# Patient Record
Sex: Female | Born: 1965 | Race: White | Hispanic: No | Marital: Married | Smoking: Never smoker
Health system: Southern US, Community
[De-identification: ages and names within clinical notes are randomized; demographics above are authoritative.]

## PROBLEM LIST (undated history)

## (undated) DIAGNOSIS — G8929 Other chronic pain: Secondary | ICD-10-CM

## (undated) DIAGNOSIS — M329 Systemic lupus erythematosus, unspecified: Secondary | ICD-10-CM

## (undated) DIAGNOSIS — J42 Unspecified chronic bronchitis: Secondary | ICD-10-CM

## (undated) DIAGNOSIS — M549 Dorsalgia, unspecified: Secondary | ICD-10-CM

## (undated) DIAGNOSIS — IMO0002 Reserved for concepts with insufficient information to code with codable children: Secondary | ICD-10-CM

## (undated) DIAGNOSIS — F329 Major depressive disorder, single episode, unspecified: Secondary | ICD-10-CM

## (undated) DIAGNOSIS — K219 Gastro-esophageal reflux disease without esophagitis: Secondary | ICD-10-CM

## (undated) DIAGNOSIS — R011 Cardiac murmur, unspecified: Secondary | ICD-10-CM

## (undated) DIAGNOSIS — F419 Anxiety disorder, unspecified: Secondary | ICD-10-CM

## (undated) DIAGNOSIS — M797 Fibromyalgia: Secondary | ICD-10-CM

## (undated) DIAGNOSIS — E669 Obesity, unspecified: Secondary | ICD-10-CM

## (undated) DIAGNOSIS — I10 Essential (primary) hypertension: Secondary | ICD-10-CM

## (undated) DIAGNOSIS — F32A Depression, unspecified: Secondary | ICD-10-CM

## (undated) DIAGNOSIS — D219 Benign neoplasm of connective and other soft tissue, unspecified: Secondary | ICD-10-CM

## (undated) DIAGNOSIS — E119 Type 2 diabetes mellitus without complications: Secondary | ICD-10-CM

## (undated) HISTORY — DX: Benign neoplasm of connective and other soft tissue, unspecified: D21.9

## (undated) HISTORY — PX: APPENDECTOMY: SHX54

---

## 1989-05-10 HISTORY — PX: TUBAL LIGATION: SHX77

## 1998-05-10 HISTORY — PX: LAPAROSCOPIC CHOLECYSTECTOMY: SUR755

## 2001-09-17 ENCOUNTER — Emergency Department (HOSPITAL_COMMUNITY): Admission: EM | Admit: 2001-09-17 | Discharge: 2001-09-17 | Payer: Self-pay

## 2001-09-24 ENCOUNTER — Emergency Department (HOSPITAL_COMMUNITY): Admission: EM | Admit: 2001-09-24 | Discharge: 2001-09-25 | Payer: Self-pay | Admitting: Emergency Medicine

## 2006-08-15 ENCOUNTER — Emergency Department (HOSPITAL_COMMUNITY): Admission: EM | Admit: 2006-08-15 | Discharge: 2006-08-15 | Payer: Self-pay | Admitting: Emergency Medicine

## 2006-08-16 ENCOUNTER — Ambulatory Visit: Payer: Self-pay | Admitting: Psychiatry

## 2006-08-16 ENCOUNTER — Inpatient Hospital Stay (HOSPITAL_COMMUNITY): Admission: AD | Admit: 2006-08-16 | Discharge: 2006-08-19 | Payer: Self-pay | Admitting: Psychiatry

## 2012-04-18 ENCOUNTER — Other Ambulatory Visit: Payer: Self-pay | Admitting: Orthopedic Surgery

## 2012-04-18 DIAGNOSIS — R609 Edema, unspecified: Secondary | ICD-10-CM

## 2012-04-19 ENCOUNTER — Inpatient Hospital Stay: Admission: RE | Admit: 2012-04-19 | Payer: Self-pay | Source: Ambulatory Visit

## 2012-04-25 ENCOUNTER — Other Ambulatory Visit: Payer: Self-pay

## 2012-06-02 ENCOUNTER — Ambulatory Visit
Admission: RE | Admit: 2012-06-02 | Discharge: 2012-06-02 | Disposition: A | Discharge status: Home / Self Care | Payer: PRIVATE HEALTH INSURANCE | Source: Ambulatory Visit | Attending: Interventional Cardiology | Admitting: Interventional Cardiology

## 2012-06-02 ENCOUNTER — Other Ambulatory Visit: Payer: Self-pay | Admitting: Interventional Cardiology

## 2012-06-02 DIAGNOSIS — R609 Edema, unspecified: Secondary | ICD-10-CM

## 2013-07-26 ENCOUNTER — Encounter (HOSPITAL_COMMUNITY): Payer: Self-pay | Admitting: Emergency Medicine

## 2013-07-26 ENCOUNTER — Emergency Department (HOSPITAL_COMMUNITY)
Admission: EM | Admit: 2013-07-26 | Discharge: 2013-07-26 | Disposition: A | Discharge status: Home / Self Care | Payer: PRIVATE HEALTH INSURANCE | Attending: Emergency Medicine | Admitting: Emergency Medicine

## 2013-07-26 ENCOUNTER — Emergency Department (HOSPITAL_COMMUNITY): Payer: PRIVATE HEALTH INSURANCE

## 2013-07-26 DIAGNOSIS — E119 Type 2 diabetes mellitus without complications: Secondary | ICD-10-CM | POA: Insufficient documentation

## 2013-07-26 DIAGNOSIS — R238 Other skin changes: Secondary | ICD-10-CM | POA: Insufficient documentation

## 2013-07-26 DIAGNOSIS — E669 Obesity, unspecified: Secondary | ICD-10-CM | POA: Insufficient documentation

## 2013-07-26 DIAGNOSIS — R079 Chest pain, unspecified: Secondary | ICD-10-CM | POA: Insufficient documentation

## 2013-07-26 DIAGNOSIS — R0602 Shortness of breath: Secondary | ICD-10-CM | POA: Insufficient documentation

## 2013-07-26 DIAGNOSIS — R110 Nausea: Secondary | ICD-10-CM | POA: Insufficient documentation

## 2013-07-26 HISTORY — DX: Obesity, unspecified: E66.9

## 2013-07-26 LAB — CBC
HCT: 31.5 % — ABNORMAL LOW (ref 36.0–46.0)
Hemoglobin: 9.7 g/dL — ABNORMAL LOW (ref 12.0–15.0)
MCH: 20.8 pg — ABNORMAL LOW (ref 26.0–34.0)
MCHC: 30.8 g/dL (ref 30.0–36.0)
MCV: 67.5 fL — ABNORMAL LOW (ref 78.0–100.0)
Platelets: 339 10*3/uL (ref 150–400)
RBC: 4.67 MIL/uL (ref 3.87–5.11)
RDW: 18.2 % — ABNORMAL HIGH (ref 11.5–15.5)
WBC: 6.6 K/uL (ref 4.0–10.5)

## 2013-07-26 LAB — BASIC METABOLIC PANEL
CO2: 27 mEq/L (ref 19–32)
Calcium: 10.2 mg/dL (ref 8.4–10.5)
GFR calc non Af Amer: >90 mL/min (ref >90)
Glucose, Bld: 161 mg/dL — ABNORMAL HIGH (ref 70–99)
Potassium: 3.5 mEq/L — ABNORMAL LOW (ref 3.7–5.3)
Sodium: 139 mEq/L (ref 137–147)

## 2013-07-26 LAB — BASIC METABOLIC PANEL WITH GFR
BUN: 11 mg/dL (ref 6–23)
Chloride: 97 meq/L (ref 96–112)
Creatinine, Ser: 0.65 mg/dL (ref 0.50–1.10)
GFR calc Af Amer: >90 mL/min (ref >90)

## 2013-07-26 LAB — I-STAT TROPONIN, ED: Troponin i, poc: 0 ng/mL (ref 0.00–0.08)

## 2013-07-26 LAB — PRO B NATRIURETIC PEPTIDE: Pro B Natriuretic peptide (BNP): 45 pg/mL (ref 0–125)

## 2013-07-26 MED ORDER — GI COCKTAIL ~~LOC~~
30.0000 mL | Freq: Once | ORAL | Status: AC
  Administered 2013-07-26: 30 mL via ORAL
  Filled 2013-07-26: qty 30

## 2013-07-26 NOTE — ED Notes (Signed)
Pt sts has history of GERD and takes Tums and omeprazole. Pt sts has mid-upper chest pain initially felt like GERD but is not relieved by current medications and patient sts pain his higher than normal. Pt endorses shortness of breath & nausea with chest pain episodes and sts it is precipitated by eating and drinking anything. Pt sts episodes typically last a few seconds and describes it a squeezing pain, non-radiating. Pt in NAD at present time. Skin warm and dry. Denies pain at present time, radial pulses 2+.

## 2013-07-26 NOTE — ED Notes (Signed)
Pt. reports intermittent mid chest pain onset 2 days ago with slight SOB , denies nausea or diaphoresis .

## 2013-07-26 NOTE — Discharge Instructions (Signed)
Gastroesophageal Reflux Disease, Adult Gastroesophageal reflux disease (GERD) happens when acid from your stomach flows up into the esophagus. When acid comes in contact with the esophagus, the acid causes soreness (inflammation) in the esophagus. Over time, GERD may create small holes (ulcers) in the lining of the esophagus. CAUSES   Increased body weight. This puts pressure on the stomach, making acid rise from the stomach into the esophagus.  Smoking. This increases acid production in the stomach.  Drinking alcohol. This causes decreased pressure in the lower esophageal sphincter (valve or ring of muscle between the esophagus and stomach), allowing acid from the stomach into the esophagus.  Late evening meals and a full stomach. This increases pressure and acid production in the stomach.  A malformed lower esophageal sphincter. Sometimes, no cause is found. SYMPTOMS   Burning pain in the lower part of the mid-chest behind the breastbone and in the mid-stomach area. This may occur twice a week or more often.  Trouble swallowing.  Sore throat.  Dry cough.  Asthma-like symptoms including chest tightness, shortness of breath, or wheezing. DIAGNOSIS  Your caregiver may be able to diagnose GERD based on your symptoms. In some cases, X-rays and other tests may be done to check for complications or to check the condition of your stomach and esophagus. TREATMENT  Your caregiver may recommend over-the-counter or prescription medicines to help decrease acid production. Ask your caregiver before starting or adding any new medicines.  HOME CARE INSTRUCTIONS   Change the factors that you can control. Ask your caregiver for guidance concerning weight loss, quitting smoking, and alcohol consumption.  Avoid foods and drinks that make your symptoms worse, such as:  Caffeine or alcoholic drinks.  Chocolate.  Peppermint or mint flavorings.  Garlic and onions.  Spicy foods.  Citrus fruits,  such as oranges, lemons, or limes.  Tomato-based foods such as sauce, chili, salsa, and pizza.  Fried and fatty foods.  Avoid lying down for the 3 hours prior to your bedtime or prior to taking a nap.  Eat small, frequent meals instead of large meals.  Wear loose-fitting clothing. Do not wear anything tight around your waist that causes pressure on your stomach.  Raise the head of your bed 6 to 8 inches with wood blocks to help you sleep. Extra pillows will not help.  Only take over-the-counter or prescription medicines for pain, discomfort, or fever as directed by your caregiver.  Do not take aspirin, ibuprofen, or other nonsteroidal anti-inflammatory drugs (NSAIDs). SEEK IMMEDIATE MEDICAL CARE IF:   You have pain in your arms, neck, jaw, teeth, or back.  Your pain increases or changes in intensity or duration.  You develop nausea, vomiting, or sweating (diaphoresis).  You develop shortness of breath, or you faint.  Your vomit is green, yellow, black, or looks like coffee grounds or blood.  Your stool is red, bloody, or black. These symptoms could be signs of other problems, such as heart disease, gastric bleeding, or esophageal bleeding. MAKE SURE YOU:   Understand these instructions.  Will watch your condition.  Will get help right away if you are not doing well or get worse. Document Released: 02/03/2005 Document Revised: 07/19/2011 Document Reviewed: 11/13/2010 [facility] Patient Information 2014 [place], [place]. Chest Pain (Nonspecific) Chest pain has many causes. Your pain could be caused by something serious, such as a heart attack or a blood clot in the lungs. It could also be caused by something less serious, such as a chest bruise or a virus.  Follow up with your doctor. More lab tests or other studies may be needed to find the cause of your pain. Most of the time, nonspecific chest pain will improve within 2 to 3 days of rest and mild pain medicine. HOME CARE  For  chest bruises, you may put ice on the sore area for 15-20 minutes, 03-04 times a day. Do this only if it makes you feel better.  Put ice in a plastic bag.  Place a towel between the skin and the bag.  Rest for the next 2 to 3 days.  Go back to work if the pain improves.  See your doctor if the pain lasts longer than 1 to 2 weeks.  Only take medicine as told by your doctor.  Quit smoking if you smoke. GET HELP RIGHT AWAY IF:   There is more pain or pain that spreads to the arm, neck, jaw, back, or belly (abdomen).  You have shortness of breath.  You cough more than usual or cough up blood.  You have very bad back or belly pain, feel sick to your stomach (nauseous), or throw up (vomit).  You have very bad weakness.  You pass out (faint).  You have a fever. Any of these problems may be serious and may be an emergency. Do not wait to see if the problems will go away. Get medical help right away. Call your local emergency services 911 in [place] Do not drive yourself to the hospital. MAKE SURE YOU:   Understand these instructions.  Will watch this condition.  Will get help right away if you or your child is not doing well or gets worse. Document Released: 10/13/2007 Document Revised: 07/19/2011 Document Reviewed: 10/13/2007 [facility] Patient Information 2014 [place], [place].

## 2013-07-26 NOTE — ED Provider Notes (Signed)
CSN: [id number]     Arrival date & time 07/26/13  1950 History   First MD Initiated Contact with Patient 07/26/13 2111     Chief Complaint  Patient presents with  . Chest Pain     (Consider location/radiation/quality/duration/timing/severity/associated sxs/prior Treatment) Patient is a 48 y.o. female presenting with chest pain. The history is provided by the patient. No language interpreter was used.  Chest Pain Pain location:  L chest Pain quality: throbbing   Pain radiates to:  Neck Pain radiates to the back: no   Pain severity:  Moderate Onset quality:  Sudden Duration:  3 days Timing:  Constant Progression:  Waxing and waning Chronicity:  New Context: at rest   Associated symptoms: nausea and shortness of breath   Associated symptoms: no lower extremity edema and not vomiting     Past Medical History  Diagnosis Date  . Diabetes mellitus without complication   . Obesity    Past Surgical History  Procedure Laterality Date  . Cholecystectomy    . Appendectomy    . Cesarean section     No family history on file. History  Substance Use Topics  . Smoking status: Never Smoker   . Smokeless tobacco: Not on file  . Alcohol Use: No   OB History   Grav Para Term Preterm Abortions TAB SAB Ect Mult Living                 Review of Systems  Respiratory: Positive for shortness of breath.   Cardiovascular: Positive for chest pain.  Gastrointestinal: Positive for nausea. Negative for vomiting.  All other systems reviewed and are negative.      Allergies  Review of patient's allergies indicates no known allergies.  Home Medications  No current outpatient prescriptions on file. BP 153/67  Pulse 97  Temp(Src) 98 F (36.7 C) (Oral)  Resp 16  Ht 5\' 2"  (1.575 m)  Wt 220 lb 8 oz (100.018 kg)  BMI 40.32 kg/m2  SpO2 98%  LMP 07/24/2013 Physical Exam  Nursing note and vitals reviewed. Constitutional: She is oriented to person, place, and time. She appears  well-developed and well-nourished.  HENT:  Head: Normocephalic.  Eyes: Pupils are equal, round, and reactive to light.  Neck: Normal range of motion.  Cardiovascular: Normal rate and regular rhythm.   Pulmonary/Chest: Effort normal and breath sounds normal.  Abdominal: Soft.  Musculoskeletal: Normal range of motion.  Neurological: She is alert and oriented to person, place, and time.  Skin: Skin is warm and dry.  Psychiatric: She has a normal mood and affect. Her behavior is normal. Judgment and thought content normal.    ED Course  Procedures (including critical care time) Labs Review Labs Reviewed  CBC - Abnormal; Notable for the following:    Hemoglobin 9.7 (*)    HCT 31.5 (*)    MCV 67.5 (*)    MCH 20.8 (*)    RDW 18.2 (*)    All other components within normal limits  BASIC METABOLIC PANEL - Abnormal; Notable for the following:    Potassium 3.5 (*)    Glucose, Bld 161 (*)    All other components within normal limits  PRO B NATRIURETIC PEPTIDE  I-STAT TROPOININ, ED   Imaging Review Dg Chest 2 View  07/26/2013   CLINICAL DATA:  Pain.  Shortness of breath  EXAM: CHEST  2 VIEW  COMPARISON:  None.  FINDINGS: The heart size and mediastinal contours are within normal limits. Both lungs are  clear. The visualized skeletal structures are unremarkable. Surgical clips right upper quadrant.  IMPRESSION: No active cardiopulmonary disease.   Electronically Signed   By: Marcello Moores  Register   On: 07/26/2013 21:09     EKG Interpretation None     Atypical chest pain, low probability of cardiac origin.  ECG without ischemic changes, normal troponin.  Chronic mild anemia.  Left upper chest wall pain.  No dyspnea, tachycardia, or cough.  MDM   Final diagnoses:  None    Chest pain    Norman Herrlich, NP 07/27/13 0022

## 2013-08-02 NOTE — ED Provider Notes (Signed)
Medical screening examination/treatment/procedure(s) were performed by non-physician practitioner and as supervising physician I was immediately available for consultation/collaboration.   EKG Interpretation   Date/Time:  Thursday July 26 2013 19:57:14 EDT Ventricular Rate:  91 PR Interval:  164 QRS Duration: 82 QT Interval:  358 QTC Calculation: 440 R Axis:   18 Text Interpretation:  Normal sinus rhythm Poor R wave progression  Borderline ECG No previous tracing Confirmed by [facility]  MD, MICHEAL ([id number])  on 07/26/2013 10:50:46 PM        Saddie Benders. Canda Podgorski, MD 08/02/13 1554

## 2014-05-10 HISTORY — PX: ENDOMETRIAL BIOPSY: SHX622

## 2015-05-26 ENCOUNTER — Encounter (HOSPITAL_COMMUNITY): Payer: Self-pay | Admitting: Emergency Medicine

## 2015-05-26 ENCOUNTER — Emergency Department (HOSPITAL_COMMUNITY): Payer: 59

## 2015-05-26 ENCOUNTER — Observation Stay (HOSPITAL_COMMUNITY)
Admission: EM | Admit: 2015-05-26 | Discharge: 2015-05-28 | Disposition: A | Discharge status: Home / Self Care | Payer: 59 | Attending: Internal Medicine | Admitting: Internal Medicine

## 2015-05-26 DIAGNOSIS — E1169 Type 2 diabetes mellitus with other specified complication: Secondary | ICD-10-CM

## 2015-05-26 DIAGNOSIS — E669 Obesity, unspecified: Secondary | ICD-10-CM | POA: Diagnosis not present

## 2015-05-26 DIAGNOSIS — D259 Leiomyoma of uterus, unspecified: Secondary | ICD-10-CM | POA: Diagnosis not present

## 2015-05-26 DIAGNOSIS — R531 Weakness: Secondary | ICD-10-CM | POA: Diagnosis not present

## 2015-05-26 DIAGNOSIS — R55 Syncope and collapse: Secondary | ICD-10-CM | POA: Diagnosis not present

## 2015-05-26 DIAGNOSIS — R5383 Other fatigue: Secondary | ICD-10-CM

## 2015-05-26 DIAGNOSIS — M7989 Other specified soft tissue disorders: Secondary | ICD-10-CM | POA: Insufficient documentation

## 2015-05-26 DIAGNOSIS — I10 Essential (primary) hypertension: Secondary | ICD-10-CM | POA: Diagnosis not present

## 2015-05-26 DIAGNOSIS — M549 Dorsalgia, unspecified: Secondary | ICD-10-CM | POA: Diagnosis not present

## 2015-05-26 DIAGNOSIS — E119 Type 2 diabetes mellitus without complications: Secondary | ICD-10-CM | POA: Diagnosis not present

## 2015-05-26 DIAGNOSIS — Z7984 Long term (current) use of oral hypoglycemic drugs: Secondary | ICD-10-CM | POA: Diagnosis not present

## 2015-05-26 DIAGNOSIS — R079 Chest pain, unspecified: Secondary | ICD-10-CM | POA: Diagnosis not present

## 2015-05-26 DIAGNOSIS — I890 Lymphedema, not elsewhere classified: Secondary | ICD-10-CM | POA: Insufficient documentation

## 2015-05-26 DIAGNOSIS — M797 Fibromyalgia: Secondary | ICD-10-CM | POA: Diagnosis not present

## 2015-05-26 DIAGNOSIS — G8929 Other chronic pain: Secondary | ICD-10-CM | POA: Diagnosis not present

## 2015-05-26 DIAGNOSIS — R0602 Shortness of breath: Secondary | ICD-10-CM | POA: Insufficient documentation

## 2015-05-26 HISTORY — DX: Unspecified chronic bronchitis: J42

## 2015-05-26 HISTORY — DX: Fibromyalgia: M79.7

## 2015-05-26 HISTORY — DX: Other chronic pain: G89.29

## 2015-05-26 HISTORY — DX: Anxiety disorder, unspecified: F41.9

## 2015-05-26 HISTORY — DX: Gastro-esophageal reflux disease without esophagitis: K21.9

## 2015-05-26 HISTORY — DX: Cardiac murmur, unspecified: R01.1

## 2015-05-26 HISTORY — DX: Depression, unspecified: F32.A

## 2015-05-26 HISTORY — DX: Dorsalgia, unspecified: M54.9

## 2015-05-26 HISTORY — DX: Essential (primary) hypertension: I10

## 2015-05-26 HISTORY — DX: Type 2 diabetes mellitus without complications: E11.9

## 2015-05-26 HISTORY — DX: Major depressive disorder, single episode, unspecified: F32.9

## 2015-05-26 LAB — URINALYSIS, ROUTINE W REFLEX MICROSCOPIC
BILIRUBIN URINE: NEGATIVE
GLUCOSE, UA: NEGATIVE mg/dL
HGB URINE DIPSTICK: NEGATIVE
Ketones, ur: NEGATIVE mg/dL
Leukocytes, UA: NEGATIVE
Nitrite: NEGATIVE
Protein, ur: NEGATIVE mg/dL
SPECIFIC GRAVITY, URINE: 1.031 — AB (ref 1.005–1.030)
pH: 5 (ref 5.0–8.0)

## 2015-05-26 LAB — CBC
HEMATOCRIT: 43.6 % (ref 36.0–46.0)
Hemoglobin: 14.2 g/dL (ref 12.0–15.0)
MCH: 28.2 pg (ref 26.0–34.0)
MCHC: 32.6 g/dL (ref 30.0–36.0)
MCV: 86.7 fL (ref 78.0–100.0)
Platelets: 288 10*3/uL (ref 150–400)
RBC: 5.03 MIL/uL (ref 3.87–5.11)
RDW: 12.8 % (ref 11.5–15.5)
WBC: 9.3 10*3/uL (ref 4.0–10.5)

## 2015-05-26 LAB — BASIC METABOLIC PANEL
ANION GAP: 12 (ref 5–15)
BUN: 19 mg/dL (ref 6–20)
CHLORIDE: 103 mmol/L (ref 101–111)
CO2: 24 mmol/L (ref 22–32)
Calcium: 9.8 mg/dL (ref 8.9–10.3)
Creatinine, Ser: 0.82 mg/dL (ref 0.44–1.00)
GFR calc Af Amer: >60 mL/min (ref >60)
GFR calc non Af Amer: >60 mL/min (ref >60)
GLUCOSE: 188 mg/dL — AB (ref 65–99)
Potassium: 4.8 mmol/L (ref 3.5–5.1)
Sodium: 139 mmol/L (ref 135–145)

## 2015-05-26 LAB — D-DIMER, QUANTITATIVE: D-Dimer, Quant: 0.51 ug/mL-FEU — ABNORMAL HIGH (ref 0.00–0.50)

## 2015-05-26 LAB — I-STAT TROPONIN, ED
Troponin i, poc: 0 ng/mL (ref 0.00–0.08)
Troponin i, poc: 0.01 ng/mL (ref 0.00–0.08)

## 2015-05-26 MED ORDER — IOHEXOL 350 MG/ML SOLN
100.0000 mL | Freq: Once | INTRAVENOUS | Status: AC | PRN
  Administered 2015-05-26: 100 mL via INTRAVENOUS

## 2015-05-26 MED ORDER — ZOLPIDEM TARTRATE 5 MG PO TABS
10.0000 mg | ORAL_TABLET | Freq: Every day | ORAL | Status: DC
  Administered 2015-05-27 (×2): 10 mg via ORAL
  Filled 2015-05-26 (×2): qty 2

## 2015-05-26 MED ORDER — ENOXAPARIN SODIUM 40 MG/0.4ML ~~LOC~~ SOLN
40.0000 mg | SUBCUTANEOUS | Status: DC
  Administered 2015-05-27 (×2): 40 mg via SUBCUTANEOUS
  Filled 2015-05-26 (×2): qty 0.4

## 2015-05-26 MED ORDER — MELATONIN 10 MG PO TABS: 10.0000 mg | ORAL_TABLET | Freq: Every day | ORAL | Status: DC

## 2015-05-26 MED ORDER — FERROUS SULFATE 325 (65 FE) MG PO TABS
325.0000 mg | ORAL_TABLET | Freq: Every day | ORAL | Status: DC
  Administered 2015-05-27: 325 mg via ORAL
  Filled 2015-05-26: qty 1

## 2015-05-26 MED ORDER — SODIUM CHLORIDE 0.9 % IV SOLN
INTRAVENOUS | Status: DC
  Administered 2015-05-27 – 2015-05-28 (×3): via INTRAVENOUS

## 2015-05-26 MED ORDER — ONDANSETRON HCL 4 MG/2ML IJ SOLN: 4.0000 mg | Freq: Four times a day (QID) | INTRAMUSCULAR | Status: DC | PRN

## 2015-05-26 MED ORDER — PANTOPRAZOLE SODIUM 40 MG PO TBEC
40.0000 mg | DELAYED_RELEASE_TABLET | Freq: Every day | ORAL | Status: DC
  Administered 2015-05-27 – 2015-05-28 (×3): 40 mg via ORAL
  Filled 2015-05-26 (×3): qty 1

## 2015-05-26 MED ORDER — ONDANSETRON HCL 4 MG PO TABS: 4.0000 mg | ORAL_TABLET | Freq: Four times a day (QID) | ORAL | Status: DC | PRN

## 2015-05-26 MED ORDER — LEVALBUTEROL HCL 0.63 MG/3ML IN NEBU: 0.6300 mg | INHALATION_SOLUTION | Freq: Four times a day (QID) | RESPIRATORY_TRACT | Status: DC | PRN

## 2015-05-26 MED ORDER — ACETAMINOPHEN 650 MG RE SUPP: 650.0000 mg | Freq: Four times a day (QID) | RECTAL | Status: DC | PRN

## 2015-05-26 MED ORDER — LISINOPRIL 20 MG PO TABS
20.0000 mg | ORAL_TABLET | Freq: Every day | ORAL | Status: DC
  Administered 2015-05-27 – 2015-05-28 (×3): 20 mg via ORAL
  Filled 2015-05-26 (×4): qty 1

## 2015-05-26 MED ORDER — VITAMIN D3 50 MCG (2000 UT) PO CAPS: 2000.0000 [IU] | ORAL_CAPSULE | Freq: Every day | ORAL | Status: DC

## 2015-05-26 MED ORDER — SODIUM CHLORIDE 0.9 % IV BOLUS (SEPSIS)
1000.0000 mL | Freq: Once | INTRAVENOUS | Status: AC
  Administered 2015-05-26: 1000 mL via INTRAVENOUS

## 2015-05-26 MED ORDER — SODIUM CHLORIDE 0.9 % IJ SOLN
3.0000 mL | Freq: Two times a day (BID) | INTRAMUSCULAR | Status: DC
  Administered 2015-05-27 – 2015-05-28 (×3): 3 mL via INTRAVENOUS

## 2015-05-26 MED ORDER — ACETAMINOPHEN 325 MG PO TABS
650.0000 mg | ORAL_TABLET | Freq: Four times a day (QID) | ORAL | Status: DC | PRN
Start: 2015-05-26 — End: 2015-05-28
  Administered 2015-05-27: 650 mg via ORAL
  Filled 2015-05-26 (×2): qty 2

## 2015-05-26 MED ORDER — MAGNESIUM 200 MG PO TABS
1.0000 | ORAL_TABLET | Freq: Every day | ORAL | Status: DC
  Administered 2015-05-27 – 2015-05-28 (×3): 200 mg via ORAL
  Filled 2015-05-26 (×6): qty 1

## 2015-05-26 MED ORDER — LYSINE 1000 MG PO TABS: 1000.0000 mg | ORAL_TABLET | Freq: Every day | ORAL | Status: DC

## 2015-05-26 MED ORDER — INSULIN ASPART 100 UNIT/ML ~~LOC~~ SOLN
0.0000 [IU] | SUBCUTANEOUS | Status: DC
  Administered 2015-05-27: 1 [IU] via SUBCUTANEOUS
  Administered 2015-05-27 (×2): 2 [IU] via SUBCUTANEOUS

## 2015-05-26 MED ORDER — ZOLPIDEM TARTRATE 5 MG PO TABS: 5.0000 mg | ORAL_TABLET | Freq: Every day | ORAL | Status: DC

## 2015-05-26 MED ORDER — VITAMIN D 1000 UNITS PO TABS
2000.0000 [IU] | ORAL_TABLET | Freq: Every day | ORAL | Status: DC
  Administered 2015-05-27 – 2015-05-28 (×3): 2000 [IU] via ORAL
  Filled 2015-05-26 (×3): qty 2

## 2015-05-26 NOTE — H&P (Addendum)
[facility] History and Physical  Elizabeth Schneider NFA:[id number] DOB: 14-Jun-1965 DOA: 05/26/2015  Referring physician: ED PCP: No PCP Per Patient   Chief Complaint: Heaviness in arms, chest, and jaw  HPI:  Elizabeth Schneider is a 50 year old female with a past medical history of diabetes mellitus type 2 and obesity; who presents with complaints of heaviness. atient states symptoms started approximately 5 days ago. Describes symptoms as a feeling of heaviness in her chest, bilateral arms, and most recently jaw yesterday. Reports difficulty in getting up and moving due to shortness of breath with exertion. She had been placed on Bactrim for urinary tract infection on 1/6 and had tolerated the medication well until the onset of her presenting symptoms. She reported that she thought that the medication could be causing her "heaviness " symptoms so she self stopped the medication. Currently, denies any burning or urinary frequency symptoms. She reports she's never had symptoms like this in the past.  Associated symptoms include pinching-like chest pain that occurs intermittently and self resolves. Patient reports that she's gained approximately 18 pounds in the last 2 months, but denies any changes in eating habits or physical activity. Patient reportedly had 4 witnessed episodes of syncope while in the emergency department. Patient notes that this is been going on since she was acute and not the specific reason why she presented today.  Upon admission to the emergency room the patient was evaluated and seen have elevated d-dimer of 0.51. Thereafter she is checked with a CT PE protocol which was deemed to be negative. Initial troponins 3 were negative, and  EKG showed sinus tachycardia. Lab work was otherwise unremarkable.  Review of Systems  Constitutional: Positive for malaise/fatigue. Negative for weight loss.       Weight gain  HENT: Negative for hearing loss and tinnitus.   Eyes: Negative for  photophobia and pain.  Respiratory: Positive for shortness of breath. Negative for hemoptysis and sputum production.   Cardiovascular: Positive for chest pain and leg swelling.  Gastrointestinal: Negative for abdominal pain and diarrhea.  Genitourinary: Negative for urgency and frequency.  Musculoskeletal: Negative for back pain, joint pain and falls.  Skin: Negative for itching and rash.  Neurological: Positive for weakness. Negative for focal weakness and seizures.  Endo/Heme/Allergies: Negative for environmental allergies. Does not bruise/bleed easily.  Psychiatric/Behavioral: Negative for hallucinations and substance abuse.       Past Medical History  Diagnosis Date  . Diabetes mellitus without complication ([place])   . Obesity      Past Surgical History  Procedure Laterality Date  . Cholecystectomy    . Appendectomy    . Cesarean section        Social History:  reports that she has never smoked. She does not have any smokeless tobacco history on file. She reports that she drinks alcohol. She reports that she does not use illicit drugs. Where does patient live--home  ? and with whom if at home? Husband Can patient participate in ADLs? Yes  Allergies  Allergen Reactions  . Hydrocodone Itching  . Other Itching    Ranch dressing ingredient  . Pravastatin Other (See Comments)    Muscle aches and pains  . Sulfa Antibiotics Other (See Comments)    Heaviness in arms    No family history on file.     Prior to Admission medications   Medication Sig Start Date End Date Taking? Authorizing Provider  acyclovir (ZOVIRAX) 400 MG tablet Take 400 mg by mouth 2 (two) times  daily as needed (for 5 days).  03/27/15  Yes Historical Provider, MD  Cholecalciferol (VITAMIN D3) 2000 units capsule Take 2,000 Units by mouth daily.   Yes Historical Provider, MD  ferrous sulfate 325 (65 FE) MG tablet Take 325 mg by mouth daily with breakfast.   Yes Historical Provider, MD  lisinopril  (PRINIVIL,ZESTRIL) 20 MG tablet Take 20 mg by mouth daily. 05/22/15  Yes Historical Provider, MD  Lysine 1000 MG TABS Take 1,000 mg by mouth daily.   Yes Historical Provider, MD  MAGNESIUM PO Take 1 tablet by mouth daily.   Yes Historical Provider, MD  Melatonin 10 MG TABS Take 10 mg by mouth at bedtime.   Yes Historical Provider, MD  metFORMIN (GLUCOPHAGE-XR) 500 MG 24 hr tablet Take 2,000 mg by mouth every evening. 05/21/15  Yes Historical Provider, MD  omeprazole (PRILOSEC) 20 MG capsule Take 20 mg by mouth daily. 06/30/13  Yes Historical Provider, MD  phenazopyridine (PYRIDIUM) 200 MG tablet Take 200 mg by mouth daily.  05/16/15  Yes Historical Provider, MD  sulfamethoxazole-trimethoprim (BACTRIM DS,SEPTRA DS) 800-160 MG tablet Take 1 tablet by mouth daily. 05/16/15  Yes Historical Provider, MD  VITAMIN E PO Take 1 tablet by mouth daily.   Yes Historical Provider, MD  zolpidem (AMBIEN) 5 MG tablet Take 5 mg by mouth at bedtime. 05/14/15  Yes Historical Provider, MD     Physical Exam: Filed Vitals:   05/26/15 1800 05/26/15 1907 05/26/15 1930 05/26/15 2002  BP: 110/72 107/64 121/70 110/65  Pulse: 99 98 100 94  Temp:      TempSrc:      Resp: '15 19 18 21  ' Height:      Weight:      SpO2: 97% 99% 99% 98%     Constitutional: Vital signs reviewed. Patient is a well-developed and well-nourished in no acute distress and cooperative with exam. Alert and oriented x3.  Head: Normocephalic and atraumatic  Ear: TM normal bilaterally  Mouth: no erythema or exudates, MMM  Eyes: PERRL, EOMI, conjunctivae normal, No scleral icterus.  Neck: Supple, Trachea midline normal ROM, No JVD, mass, thyromegaly, or carotid bruit present.  Cardiovascular: RRR, S1 normal, S2 normal, no MRG, pulses symmetric and intact bilaterally  Pulmonary/Chest: CTAB, no wheezes, rales, or rhonchi  Abdominal: Soft. Non-tender, non-distended, bowel sounds are normal, no masses, organomegaly, or guarding present.  GU: no CVA  tenderness Musculoskeletal: No joint deformities, erythema, or stiffness, ROM full and no nontender Ext: Trace lower extremity edema and no cyanosis, pulses palpable bilaterally (DP and PT)  Hematology: no cervical, inginal, or axillary adenopathy.  Neurological: A&O x3, Strenght is normal and symmetric bilaterally, cranial nerve II-XII are grossly intact, no focal motor deficit, sensory intact to light touch bilaterally.  Skin: Warm, dry and intact. No rash, cyanosis, or clubbing.  Psychiatric: Normal mood and affect. speech and behavior is normal. Judgment and thought content normal. Cognition and memory are normal.      Data Review   Micro Results No results found for this or any previous visit (from the past 240 hour(s)).  Radiology Reports Dg Chest 2 View  05/26/2015  CLINICAL DATA:  Chest pain with left arm heaviness EXAM: CHEST  2 VIEW COMPARISON:  04/24/2015 FINDINGS: Normal heart size and mediastinal contours. No acute infiltrate or edema. No effusion or pneumothorax. No acute osseous findings. Cholecystectomy IMPRESSION: No evidence of active disease. Electronically Signed   By: Monte Fantasia M.D.   On: 05/26/2015 13:55   Ct  Angio Chest Pe W/cm &/or Wo Cm  05/26/2015  EXAM: CT ANGIOGRAPHY CHEST WITH CONTRAST TECHNIQUE: Multidetector CT imaging of the chest was performed using the standard protocol during bolus administration of intravenous contrast. Multiplanar CT image reconstructions and MIPs were obtained to evaluate the vascular anatomy. CONTRAST:  1109m OMNIPAQUE IOHEXOL 350 MG/ML SOLN COMPARISON:  None. FINDINGS: Mediastinum/Nodes: Sub cm right thyroid nodule, low attenuation without any complicating features. No pericardial effusion. No significant hilar or mediastinal adenopathy. Thoracic aorta shows no dissection or dilatation. No filling defects in the pulmonary arterial system. Lungs/Pleura: Lungs are clear.  No pleural effusion. Upper abdomen: No acute abnormalities  Musculoskeletal: No acute abnormality Review of the MIP images confirms the above findings. IMPRESSION: No acute findings.  No evidence of pulmonary embolism. Electronically Signed   By: RSkipper ClicheM.D.   On: 05/26/2015 18:00     CBC  Recent Labs Lab 05/26/15 1258  WBC 9.3  HGB 14.2  HCT 43.6  PLT 288  MCV 86.7  MCH 28.2  MCHC 32.6  RDW 12.8    Chemistries   Recent Labs Lab 05/26/15 1258  NA 139  K 4.8  CL 103  CO2 24  GLUCOSE 188*  BUN 19  CREATININE 0.82  CALCIUM 9.8   ------------------------------------------------------------------------------------------------------------------ estimated creatinine clearance is 93 mL/min (by C-G formula based on Cr of 0.82). ------------------------------------------------------------------------------------------------------------------ No results for input(s): HGBA1C in the last 72 hours. ------------------------------------------------------------------------------------------------------------------ No results for input(s): CHOL, HDL, LDLCALC, TRIG, CHOLHDL, LDLDIRECT in the last 72 hours. ------------------------------------------------------------------------------------------------------------------ No results for input(s): TSH, T4TOTAL, T3FREE, THYROIDAB in the last 72 hours.  Invalid input(s): FREET3 ------------------------------------------------------------------------------------------------------------------ No results for input(s): VITAMINB12, FOLATE, FERRITIN, TIBC, IRON, RETICCTPCT in the last 72 hours.  Coagulation profile No results for input(s): INR, PROTIME in the last 168 hours.   Recent Labs  05/26/15 1258  DDIMER 0.51*    Cardiac Enzymes No results for input(s): CKMB, TROPONINI, MYOGLOBIN in the last 168 hours.  Invalid input(s): CK ------------------------------------------------------------------------------------------------------------------ Invalid input(s): POCBNP   CBG: No  results for input(s): GLUCAP in the last 168 hours.     EKG: Independently reviewed. Sinus tachycardia   Assessment/Plan Principal Problem: Syncope: Acute on chronic. Patient was reported to have 4 seizures while in the emergency department. Although patient reports this is been going on since she was a child warrants further investigation. Initial troponin is negative, and CT with PE protocol after elevated d-dimer negative as well. - Admit to telemetry bed  - Check orthostatic vital signs 2 - Check TSH, free T4 - Echocardiogram in a.m.  Fatigue/Generalized weakness: Acute onset of patient fatigue symptoms is confusing as she reports feelings of heaviness in multiple extremities as well as recent weight gain. -Check ANA, ESR, CRP, as well as labs seen above  Chest pain: Recurrent. Although initial troponins negative 3 - Follow-up telemetry overnight - Repeat EKG  in a.m.  Diabetes mellitus type 2 in obese ([facility]: Initial blood glucose mildly elevated at 188. Patient on metformin at home. - Check hemoglobin A1c in a.m. - Held metformin   - CBGs with sensitive sliding scale insulin  Code Status:   full Family Communication: bedside Disposition Plan: admit   Total time spent 55 minutes.Greater than 50% of this time was spent in counseling, explanation of diagnosis, planning of further management, and coordination of care  [facility] Pager [phone] If 7PM-7AM, please contact night-coverage [email or web address] Password [email or web address]1/16/2017, 8:31 PM

## 2015-05-26 NOTE — ED Notes (Signed)
Pt placed into gown and on to monitor upon arrival to room. Pt monitored by blood pressure, pulse ox, and 5 lead. Pt noted to have visitors at bedside.

## 2015-05-26 NOTE — ED Provider Notes (Signed)
CSN: [id number]     Arrival date & time 05/26/15  1240 History   First MD Initiated Contact with Patient 05/26/15 1528     Chief Complaint  Patient presents with  . Chest Pain     (Consider location/radiation/quality/duration/timing/severity/associated sxs/prior Treatment) Patient is a 50 y.o. female presenting with general illness. The history is provided by the patient.  Illness Location:  SOB, shoulder heaviness, and syncope Quality:  Worsening Severity:  Moderate Onset quality:  Gradual Duration:  7 days Timing:  Constant Progression:  Worsening Chronicity:  New Context:  Recently treated UTI Relieved by:  Nothing Worsened by:  Nothing Ineffective treatments:  Nothing Associated symptoms: chest pain (had episode of epigastric sharp pain yesterday briefly), loss of consciousness and shortness of breath   Associated symptoms: no abdominal pain, no congestion, no cough, no diarrhea, no fever, no headaches and no rash     Past Medical History  Diagnosis Date  . Diabetes mellitus without complication ([place])   . Obesity    Past Surgical History  Procedure Laterality Date  . Cholecystectomy    . Appendectomy    . Cesarean section     No family history on file. Social History  Substance Use Topics  . Smoking status: Never Smoker   . Smokeless tobacco: None  . Alcohol Use: Yes   OB History    No data available     Review of Systems  Constitutional: Negative for fever.  HENT: Negative for congestion.   Eyes: Negative for redness and visual disturbance.  Respiratory: Positive for shortness of breath. Negative for cough.   Cardiovascular: Positive for chest pain (had episode of epigastric sharp pain yesterday briefly).  Gastrointestinal: Negative for abdominal pain and diarrhea.  Genitourinary: Negative.   Skin: Negative for pallor, rash and wound.  Neurological: Positive for loss of consciousness. Negative for seizures and headaches.  Psychiatric/Behavioral:  Negative.       Allergies  Hydrocodone  Home Medications   Prior to Admission medications   Medication Sig Start Date End Date Taking? Authorizing Provider  FLUoxetine (PROZAC) 10 MG tablet Take 10 mg by mouth daily. 06/15/13   Historical Provider, MD  metFORMIN (GLUCOPHAGE) 1000 MG tablet Take 2,000 mg by mouth every evening.     Historical Provider, MD  omeprazole (PRILOSEC) 20 MG capsule Take 20 mg by mouth daily. 06/30/13   Historical Provider, MD  pravastatin (PRAVACHOL) 80 MG tablet Take 80 mg by mouth daily.    Historical Provider, MD  zolpidem (AMBIEN) 10 MG tablet Take 10 mg by mouth at bedtime. 07/11/13   Historical Provider, MD   BP 102/75 mmHg  Pulse 103  Temp(Src) 97.8 F (36.6 C) (Oral)  Resp 22  Ht 5\' 2"  (1.575 m)  Wt 104.327 kg  BMI 42.06 kg/m2  SpO2 97% Physical Exam  Constitutional: She appears well-developed and well-nourished. No distress.  HENT:  Head: Normocephalic.  Mouth/Throat: No oropharyngeal exudate.  Eyes: Pupils are equal, round, and reactive to light. No scleral icterus.  Neck: Normal range of motion. Neck supple. No JVD present. No tracheal deviation present.  Cardiovascular: Normal rate, regular rhythm, normal heart sounds and intact distal pulses.  Exam reveals no gallop and no friction rub.   No murmur heard. Pulmonary/Chest: Effort normal. No respiratory distress. She exhibits no tenderness.  Abdominal: Soft. She exhibits no distension. There is no tenderness. There is no rebound and no guarding.  Musculoskeletal: Normal range of motion. She exhibits no edema or tenderness.  Neurological:  She is alert. No cranial nerve deficit. She exhibits normal muscle tone. Coordination normal.  Skin: Skin is warm. No rash noted. She is not diaphoretic. No erythema. No pallor.  Psychiatric: She has a normal mood and affect.  Nursing note and vitals reviewed.   ED Course  Procedures (including critical care time) Labs Review Labs Reviewed  BASIC  METABOLIC PANEL - Abnormal; Notable for the following:    Glucose, Bld 188 (*)    All other components within normal limits  CBC  I-STAT TROPOININ, ED    Imaging Review Dg Chest 2 View  05/26/2015  CLINICAL DATA:  Chest pain with left arm heaviness EXAM: CHEST  2 VIEW COMPARISON:  04/24/2015 FINDINGS: Normal heart size and mediastinal contours. No acute infiltrate or edema. No effusion or pneumothorax. No acute osseous findings. Cholecystectomy IMPRESSION: No evidence of active disease. Electronically Signed   By: Monte Fantasia M.D.   On: 05/26/2015 13:55   I have personally reviewed and evaluated these images and lab results as part of my medical decision-making.   EKG Interpretation None      MDM   Final diagnoses:  None    Patient is a 51 year old female past medical history diabetes who presents with 5 days of heaviness on her chest, shoulders, arms, shortness of breath, and also had 4 syncopal episodes today. Was recently treated for UTI. She has otherwise been in her usual state of health. Further history and exam as above notable for stable vital signs and reassuring physical exam. EKG with S wave in lead 1 Q wave in lead 3 both of which appear old but also with inverted T-wave in lead 3 which is new. Otherwise no acute ischemic changes or arrhythmias. CBC and BMP reassuring. Delta troponin negative. D-dimer obtained due to low risk per well's score which came back positive. CT PE study obtained negative for PE. Given multiple episodes of syncope over the past 24 hours with current symptoms patient will be admitted to medicine for further management and evaluation.   Heriberto Antigua, MD 05/27/15 1157  Davonna Belling, MD 05/28/15 [phone]

## 2015-05-26 NOTE — ED Notes (Signed)
Phlebotomy at the bedside  

## 2015-05-26 NOTE — ED Notes (Signed)
Resident at the bedside

## 2015-05-26 NOTE — ED Notes (Signed)
Pt reports intermittent numbness to her right arm and left upper thigh with genralized fatigue more often.

## 2015-05-26 NOTE — Progress Notes (Signed)
Patient transferred from the ED via stretcher to room 3E16. Oriented patient to room and room equipment and to the heart failure floor. Verified placement of telemetry monitor and and telemetry box with CCMD. VSS. Orthostatic vitals will be performed per order. Currently complains of no pain or discomfort. Will continue to monitor patient to end of shift.

## 2015-05-26 NOTE — ED Notes (Signed)
CT made aware patient has correct IV

## 2015-05-26 NOTE — ED Notes (Signed)
Admitting at bedside 

## 2015-05-26 NOTE — ED Notes (Signed)
Onset of chest heaviness for 3 days. Seen primary Doctor one week ago given antibiotics for UTI. States chest heaviness currently 4/10 increases to 8/10 heaviness intermittently.

## 2015-05-27 ENCOUNTER — Encounter (HOSPITAL_COMMUNITY): Payer: Self-pay | Admitting: General Practice

## 2015-05-27 ENCOUNTER — Observation Stay (HOSPITAL_COMMUNITY): Payer: 59

## 2015-05-27 ENCOUNTER — Observation Stay (HOSPITAL_BASED_OUTPATIENT_CLINIC_OR_DEPARTMENT_OTHER): Payer: 59

## 2015-05-27 DIAGNOSIS — E1169 Type 2 diabetes mellitus with other specified complication: Secondary | ICD-10-CM

## 2015-05-27 DIAGNOSIS — R5383 Other fatigue: Secondary | ICD-10-CM | POA: Diagnosis present

## 2015-05-27 DIAGNOSIS — R55 Syncope and collapse: Secondary | ICD-10-CM

## 2015-05-27 DIAGNOSIS — R079 Chest pain, unspecified: Secondary | ICD-10-CM | POA: Diagnosis not present

## 2015-05-27 DIAGNOSIS — E669 Obesity, unspecified: Secondary | ICD-10-CM

## 2015-05-27 DIAGNOSIS — E119 Type 2 diabetes mellitus without complications: Secondary | ICD-10-CM | POA: Diagnosis not present

## 2015-05-27 DIAGNOSIS — R531 Weakness: Secondary | ICD-10-CM

## 2015-05-27 LAB — BASIC METABOLIC PANEL
ANION GAP: 11 (ref 5–15)
BUN: 15 mg/dL (ref 6–20)
CALCIUM: 9.4 mg/dL (ref 8.9–10.3)
CO2: 21 mmol/L — AB (ref 22–32)
Chloride: 104 mmol/L (ref 101–111)
Creatinine, Ser: 0.73 mg/dL (ref 0.44–1.00)
GFR calc Af Amer: >60 mL/min (ref >60)
GFR calc non Af Amer: >60 mL/min (ref >60)
GLUCOSE: 137 mg/dL — AB (ref 65–99)
POTASSIUM: 4.2 mmol/L (ref 3.5–5.1)
Sodium: 136 mmol/L (ref 135–145)

## 2015-05-27 LAB — GLUCOSE, CAPILLARY
GLUCOSE-CAPILLARY: 148 mg/dL — AB (ref 65–99)
GLUCOSE-CAPILLARY: 127 mg/dL — AB (ref 65–99)
GLUCOSE-CAPILLARY: 131 mg/dL — AB (ref 65–99)
Glucose-Capillary: 156 mg/dL — ABNORMAL HIGH (ref 65–99)
Glucose-Capillary: 170 mg/dL — ABNORMAL HIGH (ref 65–99)

## 2015-05-27 LAB — CK: Total CK: 35 U/L — ABNORMAL LOW (ref 38–234)

## 2015-05-27 LAB — URINE CULTURE

## 2015-05-27 LAB — MAGNESIUM: Magnesium: 1.5 mg/dL — ABNORMAL LOW (ref 1.7–2.4)

## 2015-05-27 LAB — T4, FREE: FREE T4: 0.91 ng/dL (ref 0.61–1.12)

## 2015-05-27 LAB — TSH: TSH: 2.352 u[IU]/mL (ref 0.350–4.500)

## 2015-05-27 LAB — C-REACTIVE PROTEIN: CRP: 1.1 mg/dL — ABNORMAL HIGH (ref <1.0)

## 2015-05-27 LAB — SEDIMENTATION RATE: SED RATE: 22 mm/h (ref 0–22)

## 2015-05-27 MED ORDER — INSULIN ASPART 100 UNIT/ML ~~LOC~~ SOLN
0.0000 [IU] | Freq: Three times a day (TID) | SUBCUTANEOUS | Status: DC
  Administered 2015-05-28: 2 [IU] via SUBCUTANEOUS
  Administered 2015-05-28: 1 [IU] via SUBCUTANEOUS

## 2015-05-27 MED ORDER — GADOBENATE DIMEGLUMINE 529 MG/ML IV SOLN
20.0000 mL | Freq: Once | INTRAVENOUS | Status: AC | PRN
  Administered 2015-05-27: 20 mL via INTRAVENOUS

## 2015-05-27 MED ORDER — MAGNESIUM SULFATE 2 GM/50ML IV SOLN
2.0000 g | Freq: Once | INTRAVENOUS | Status: AC
  Administered 2015-05-27: 2 g via INTRAVENOUS
  Filled 2015-05-27: qty 50

## 2015-05-27 NOTE — Progress Notes (Signed)
Although pt has c/o past syncopal episodes, she continues to refuse bed/chair alarm.  Pt educated on rationale for bed/chair alarms, yet still refuses.  Will continue to hourly round to insure pt safety.

## 2015-05-27 NOTE — Progress Notes (Signed)
Please note patient has stated she has had syncopal episodes in the past and also stated had five syncopal episodes in the emergency department but was not mentioned to nurse in report. Explained to patient of fall risk precautions and policy on our floor and that we encourage and notify patient of placement of bed alarm to prevent falls and for safety. Patient refuses for the bed alarm to be on and has be educated of reasons for bed alarm and patient understood. Will continue to hourly round on patient between nurse and nurse tech to still promote prevention of fall. Will continue to monitor patient to end of shift.

## 2015-05-27 NOTE — Progress Notes (Signed)
  Echocardiogram 2D Echocardiogram has been performed.  Elizabeth Schneider 05/27/2015, 10:13 AM

## 2015-05-27 NOTE — Progress Notes (Signed)
[facility]                                                                              Patient Demographics  Elizabeth Schneider, is a 50 y.o. female, DOB - 08-Dec-1965, [id number]  Admit date - 05/26/2015   Admitting Physician Norval Morton, MD  Outpatient Primary MD for the patient is Charletta Cousin, MD  LOS -    Chief Complaint  Patient presents with  . Chest Pain      HPI on 05/26/15 by Dr. Fuller Plan Elizabeth Schneider is a 50 year old female with a past medical history of diabetes mellitus type 2 and obesity; who presents with complaints of heaviness. atient states symptoms started approximately 5 days ago. Describes symptoms as a feeling of heaviness in her chest, bilateral arms, and most recently jaw yesterday. Reports difficulty in getting up and moving due to shortness of breath with exertion. She had been placed on Bactrim for urinary tract infection on 1/6 and had tolerated the medication well until the onset of her presenting symptoms. She reported that she thought that the medication could be causing her "heaviness " symptoms so she self stopped the medication. Currently, denies any burning or urinary frequency symptoms. She reports she's never had symptoms like this in the past. Associated symptoms include pinching-like chest pain that occurs intermittently and self resolves. Patient reports that she's gained approximately 18 pounds in the last 2 months, but denies any changes in eating habits or physical activity. Patient reportedly had 4 witnessed episodes of syncope while in the emergency department. Patient notes that this is been going on since she was acute and not the specific reason why she presented today.  Upon admission to the emergency room the patient was evaluated and seen have elevated d-dimer of 0.51. Thereafter she is checked with a CT PE protocol which was deemed to be negative. Initial troponins 3 were negative, and EKG showed sinus tachycardia. Lab work was  otherwise unremarkable.  Assessment & Plan   Syncope -Patient states she has been "passing out" and does not remember the event -The events vary and are different every time -Will obtain MRI with and without contrast -Will obtain EEG to rule out seizure -Will order carotid doppler  -Echocardiogram EF 60-65%, no ASD or PFO -PT consulted -Orthostatic vitals unremarkable -UA and CXR unremarkable  Fatigue/Generalized weakness/Upper ext heaviness -TSH 2.352, FT4 0.91 WNL -ESR 22, CRP 1.1 -ANA pending -CK 35 -Vitamin D pending -PT consulted -Patient would benefit from outpatient neurology workup   Chest pain/heaviness/ elevated DDimer -Troponins cycled and unremarkable -Echo as above -Had elevated ddimer 0.51 -CT chest neg PE  Hypertension -Continue lisinopril  Diabetes mellitus, type 2 -Metformin held -Continue insulin sliding scale CBG monitoring -Hemoglobin A1c  Uterine fibroid -Follow up with OB/GYN as an outpatient  Left arm swelling -Per patient this is been ongoing for several years, had ultrasound which was negative for DVT -Patient needs to discuss this with her primary care physician  Hypomagnesemia -Magnesium 1.5, continue by mouth magnesium supplements, given IV magnesium as well  Code Status: Full  Family Communication: Husband at bedside  Disposition Plan: Admitted for observation. Pending workup  Time Spent in  minutes   30  minutes  Procedures  Echocardiogram  Consults   Neurology via phone  DVT Prophylaxis  Lovneox  Lab Results  Component Value Date   PLT 288 05/26/2015    Medications  Scheduled Meds: . cholecalciferol  2,000 Units Oral Daily  . enoxaparin (LOVENOX) injection  40 mg Subcutaneous Q24H  . ferrous sulfate  325 mg Oral Q breakfast  . insulin aspart  0-9 Units Subcutaneous 6 times per day  . lisinopril  20 mg Oral Daily  . Magnesium  1 tablet Oral Daily  . pantoprazole  40 mg Oral Daily  . sodium chloride  3 mL  Intravenous Q12H  . zolpidem  10 mg Oral QHS   Continuous Infusions: . sodium chloride 75 mL/hr at 05/27/15 0032   PRN Meds:.acetaminophen **OR** acetaminophen, levalbuterol, ondansetron **OR** ondansetron (ZOFRAN) IV  Antibiotics    Anti-infectives    None     Subjective:   Elizabeth Schneider seen and examined today. Patient has complaints of heaviness in her arms. She also complains of her left arm swelling which has been ongoing for several years. She states she also has a fibroid and wonders if that could be removed. Patient denies any chest pain at this time. She denies any shortness of breath, abdominal pain, nausea or vomiting. Denies any dizziness. Patient states her her syncopal episodes have been very random and different other than ongoing for approximately 1-2 weeks. Further exacerbated by a hot shower or illness.  Objective:   Filed Vitals:   05/27/15 0018 05/27/15 0637 05/27/15 1020 05/27/15 1119  BP:  98/55 108/65 130/82  Pulse:  82 88 88  Temp: 98.5 F (36.9 C) 98.8 F (37.1 C)  98.8 F (37.1 C)  TempSrc: Oral Oral  Oral  Resp:  20  18  Height:      Weight:  102.468 kg (225 lb 14.4 oz)    SpO2:  98%  98%    Wt Readings from Last 3 Encounters:  05/27/15 102.468 kg (225 lb 14.4 oz)  07/26/13 100.018 kg (220 lb 8 oz)     Intake/Output Summary (Last 24 hours) at 05/27/15 1318 Last data filed at 05/27/15 1027  Gross per 24 hour  Intake    516 ml  Output    400 ml  Net    116 ml    Exam  General: Well developed, well nourished, NAD, appears stated age  HEENT: NCAT,  mucous membranes moist.   Cardiovascular: S1 S2 auscultated, no rubs, murmurs or gallops. Regular rate and rhythm.  Respiratory: Clear to auscultation bilaterally with equal chest rise  Abdomen: Soft, obese, nontender, nondistended, + bowel sounds  Extremities: warm dry without cyanosis clubbing. LUE larger than Right. Trace LE edema.  Neuro: AAOx3, nonfocal  Skin: Without rashes exudates  or nodules  Psych: flat affect  Data Review   Micro Results Recent Results (from the past 240 hour(s))  Urine culture     Status: None   Collection Time: 05/26/15  6:03 PM  Result Value Ref Range Status   Specimen Description URINE, CLEAN CATCH  Final   Special Requests NONE  Final   Culture MULTIPLE SPECIES PRESENT, SUGGEST RECOLLECTION  Final   Report Status 05/27/2015 FINAL  Final    Radiology Reports Dg Chest 2 View  05/26/2015  CLINICAL DATA:  Chest pain with left arm heaviness EXAM: CHEST  2 VIEW COMPARISON:  04/24/2015 FINDINGS: Normal heart size and mediastinal contours. No acute infiltrate or edema. No  effusion or pneumothorax. No acute osseous findings. Cholecystectomy IMPRESSION: No evidence of active disease. Electronically Signed   By: Monte Fantasia M.D.   On: 05/26/2015 13:55   Ct Angio Chest Pe W/cm &/or Wo Cm  05/26/2015  EXAM: CT ANGIOGRAPHY CHEST WITH CONTRAST TECHNIQUE: Multidetector CT imaging of the chest was performed using the standard protocol during bolus administration of intravenous contrast. Multiplanar CT image reconstructions and MIPs were obtained to evaluate the vascular anatomy. CONTRAST:  165m OMNIPAQUE IOHEXOL 350 MG/ML SOLN COMPARISON:  None. FINDINGS: Mediastinum/Nodes: Sub cm right thyroid nodule, low attenuation without any complicating features. No pericardial effusion. No significant hilar or mediastinal adenopathy. Thoracic aorta shows no dissection or dilatation. No filling defects in the pulmonary arterial system. Lungs/Pleura: Lungs are clear.  No pleural effusion. Upper abdomen: No acute abnormalities Musculoskeletal: No acute abnormality Review of the MIP images confirms the above findings. IMPRESSION: No acute findings.  No evidence of pulmonary embolism. Electronically Signed   By: RSkipper ClicheM.D.   On: 05/26/2015 18:00    CBC  Recent Labs Lab 05/26/15 1258  WBC 9.3  HGB 14.2  HCT 43.6  PLT 288  MCV 86.7  MCH 28.2  MCHC 32.6   RDW 12.8    Chemistries   Recent Labs Lab 05/26/15 1258 05/27/15 0610  NA 139 136  K 4.8 4.2  CL 103 104  CO2 24 21*  GLUCOSE 188* 137*  BUN 19 15  CREATININE 0.82 0.73  CALCIUM 9.8 9.4  MG  --  1.5*   ------------------------------------------------------------------------------------------------------------------ estimated creatinine clearance is 94.4 mL/min (by C-G formula based on Cr of 0.73). ------------------------------------------------------------------------------------------------------------------ No results for input(s): HGBA1C in the last 72 hours. ------------------------------------------------------------------------------------------------------------------ No results for input(s): CHOL, HDL, LDLCALC, TRIG, CHOLHDL, LDLDIRECT in the last 72 hours. ------------------------------------------------------------------------------------------------------------------  Recent Labs  05/27/15 0610  TSH 2.352   ------------------------------------------------------------------------------------------------------------------ No results for input(s): VITAMINB12, FOLATE, FERRITIN, TIBC, IRON, RETICCTPCT in the last 72 hours.  Coagulation profile No results for input(s): INR, PROTIME in the last 168 hours.   Recent Labs  05/26/15 1258  DDIMER 0.51*    Cardiac Enzymes No results for input(s): CKMB, TROPONINI, MYOGLOBIN in the last 168 hours.  Invalid input(s): CK ------------------------------------------------------------------------------------------------------------------ Invalid input(s): POCBNP    Taydem Cavagnaro D.O. on 05/27/2015 at 1:18 PM  Between 7am to 7pm - Pager - [phone] After 7pm go to [email or web address] - password [email or web address]  And look for the night coverage person covering for me after hours  [facility] Office  [phone]

## 2015-05-28 ENCOUNTER — Observation Stay (HOSPITAL_COMMUNITY): Payer: 59

## 2015-05-28 ENCOUNTER — Observation Stay (HOSPITAL_BASED_OUTPATIENT_CLINIC_OR_DEPARTMENT_OTHER): Payer: 59

## 2015-05-28 DIAGNOSIS — E119 Type 2 diabetes mellitus without complications: Secondary | ICD-10-CM | POA: Diagnosis not present

## 2015-05-28 DIAGNOSIS — R079 Chest pain, unspecified: Secondary | ICD-10-CM | POA: Insufficient documentation

## 2015-05-28 DIAGNOSIS — R55 Syncope and collapse: Secondary | ICD-10-CM

## 2015-05-28 DIAGNOSIS — R569 Unspecified convulsions: Secondary | ICD-10-CM | POA: Diagnosis not present

## 2015-05-28 DIAGNOSIS — R0602 Shortness of breath: Secondary | ICD-10-CM

## 2015-05-28 DIAGNOSIS — R5383 Other fatigue: Secondary | ICD-10-CM | POA: Diagnosis not present

## 2015-05-28 LAB — CBC
HCT: 37.3 % (ref 36.0–46.0)
Hemoglobin: 12.3 g/dL (ref 12.0–15.0)
MCH: 28.7 pg (ref 26.0–34.0)
MCHC: 33 g/dL (ref 30.0–36.0)
MCV: 86.9 fL (ref 78.0–100.0)
PLATELETS: 193 10*3/uL (ref 150–400)
RBC: 4.29 MIL/uL (ref 3.87–5.11)
RDW: 13 % (ref 11.5–15.5)
WBC: 5 10*3/uL (ref 4.0–10.5)

## 2015-05-28 LAB — GLUCOSE, CAPILLARY
GLUCOSE-CAPILLARY: 120 mg/dL — AB (ref 65–99)
Glucose-Capillary: 127 mg/dL — ABNORMAL HIGH (ref 65–99)
Glucose-Capillary: 181 mg/dL — ABNORMAL HIGH (ref 65–99)

## 2015-05-28 LAB — BASIC METABOLIC PANEL
ANION GAP: 7 (ref 5–15)
BUN: 11 mg/dL (ref 6–20)
CALCIUM: 8.5 mg/dL — AB (ref 8.9–10.3)
CO2: 22 mmol/L (ref 22–32)
CREATININE: 0.67 mg/dL (ref 0.44–1.00)
Chloride: 108 mmol/L (ref 101–111)
GLUCOSE: 146 mg/dL — AB (ref 65–99)
Potassium: 4 mmol/L (ref 3.5–5.1)
Sodium: 137 mmol/L (ref 135–145)

## 2015-05-28 LAB — HEMOGLOBIN A1C
Hgb A1c MFr Bld: 7 % — ABNORMAL HIGH (ref 4.8–5.6)
MEAN PLASMA GLUCOSE: 154 mg/dL

## 2015-05-28 LAB — VITAMIN D 25 HYDROXY (VIT D DEFICIENCY, FRACTURES): VIT D 25 HYDROXY: 25.5 ng/mL — AB (ref 30.0–100.0)

## 2015-05-28 MED ORDER — FERROUS SULFATE 325 (65 FE) MG PO TABS
325.0000 mg | ORAL_TABLET | Freq: Every day | ORAL | Status: DC
  Administered 2015-05-28: 325 mg via ORAL
  Filled 2015-05-28: qty 1

## 2015-05-28 NOTE — Procedures (Signed)
History: 50 yo F with seizures since childhood  Sedation: None  Technique: This is a 21 channel routine scalp EEG performed at the bedside with bipolar and monopolar montages arranged in accordance to the international 10/20 system of electrode placement. One channel was dedicated to EKG recording.    Background: The background consists of intermixed alpha and beta activities. There is a well defined posterior dominant rhythm of 12 Hz that attenuates with eye opening. Sleep is recorded with normal appearing structures.   Photic stimulation: Physiologic driving is present  EEG Abnormalities: None  Clinical Interpretation: This normal EEG is recorded in the waking and sleep state. There was no seizure or seizure predisposition recorded on this study. Please note that a normal EEG does not preclude the possibility of epilepsy.   Roland Rack, MD [facility] [phone]  If 7pm- 7am, please page neurology on call as listed in [place].

## 2015-05-28 NOTE — Discharge Summary (Signed)
Physician Discharge Summary  Elizabeth Schneider AXE:[id number] DOB: November 10, 1965 DOA: 05/26/2015  PCP: Charletta Cousin, MD  Admit date: 05/26/2015 Discharge date: 05/28/2015  Time spent: 45 minutes  Recommendations for Outpatient Follow-up:  Patient will be discharged to home.  Patient will need to follow up with primary care provider within one week of discharge.  Follow up with neurology and rheumatology as an outpatient.  Patient should continue medications as prescribed.  Patient should follow a heart healthy/carb modified diet.  Patient advised not to drive or operate heavy machinery, mountain climbing, deep sea diving.  PT outpatient for left arm lymphedema.   Discharge Diagnoses:  Syncope Fatigue/generalized weakness/upper actually heaviness Chest pain/heaviness/elevated d-dimer Hypertension Diabetes mellitus, type II Uterine fibroid Left arm swelling Hypomagnesemia  Discharge Condition:  Stable  Diet recommendation: Heart healthy/carb modified  Filed Weights   05/26/15 1250 05/27/15 0637 05/28/15 0548  Weight: 104.327 kg (230 lb) 102.468 kg (225 lb 14.4 oz) 102.513 kg (226 lb)    History of present illness:  on 05/26/15 by Dr. Fuller Plan Ms. Elizabeth Schneider is a 50 year old female with a past medical history of diabetes mellitus type 2 and obesity; who presents with complaints of heaviness. atient states symptoms started approximately 5 days ago. Describes symptoms as a feeling of heaviness in her chest, bilateral arms, and most recently jaw yesterday. Reports difficulty in getting up and moving due to shortness of breath with exertion. She had been placed on Bactrim for urinary tract infection on 1/6 and had tolerated the medication well until the onset of her presenting symptoms. She reported that she thought that the medication could be causing her "heaviness " symptoms so she self stopped the medication. Currently, denies any burning or urinary frequency symptoms. She reports she's never had  symptoms like this in the past. Associated symptoms include pinching-like chest pain that occurs intermittently and self resolves. Patient reports that she's gained approximately 18 pounds in the last 2 months, but denies any changes in eating habits or physical activity. Patient reportedly had 4 witnessed episodes of syncope while in the emergency department. Patient notes that this is been going on since she was acute and not the specific reason why she presented today.  Upon admission to the emergency room the patient was evaluated and seen have elevated d-dimer of 0.51. Thereafter she is checked with a CT PE protocol which was deemed to be negative. Initial troponins 3 were negative, and EKG showed sinus tachycardia. Lab work was otherwise unremarkable.  Hospital Course:  Syncope -Patient states she has been "passing out" and does not remember the event -The events vary and are different every time and have been occurring since she was 50 years old. -MRI brain: normal MRI -EEG: No abnormalities, no seizure or seizure predisposition recorded on the study. -Carotid doppler: 1-39% ICA stenosis, vertebral artery flow is antegrade -Echocardiogram EF 60-65%, no ASD or PFO -PT consulted and recommended outpatient PT for lymphedema  -Orthostatic vitals unremarkable -UA and CXR unremarkable -recommended patient follow up with PCP, neurology as an outpatient   Fatigue/Generalized weakness/Upper ext heaviness -TSH 2.352, FT4 0.91 WNL -ESR 22, CRP 1.1 -ANA pending -CK 35 -Vitamin D 25.5- continue supplementation -PT consulted -Patient would benefit from outpatient neurology workup   Chest pain/heaviness/ elevated DDimer -Troponins cycled and unremarkable -Echo as above -Had elevated ddimer 0.51 -CT chest neg PE  Hypertension -Continue lisinopril  Diabetes mellitus, type 2 -Metformin held -Continue insulin sliding scale CBG monitoring -Hemoglobin A1c 7.0  Uterine fibroid -Follow up  with OB/GYN as an outpatient  Left arm swelling -Per patient this is been ongoing for several years, had ultrasound which was negative for DVT -Patient needs to discuss this with her primary care physician  Hypomagnesemia -Magnesium 1.5, continue by mouth magnesium supplements, was given IV magnesium as well  Procedures  Echocardiogram Carotid doppler EEG  Consults  Neurology via phone  Discharge Exam: Filed Vitals:   05/28/15 1129 05/28/15 1230  BP: 124/63 123/79  Pulse: 79 82  Temp:  98 F (36.7 C)  Resp:  18   Exam  General: Well developed, well nourished, NAD, appears stated age  HEENT: NCAT, mucous membranes moist.   Cardiovascular: S1 S2 auscultated, RRR, no murmurs  Respiratory: Clear to auscultation bilaterally  Abdomen: Soft, obese, nontender, nondistended, + bowel sounds  Extremities: warm dry without cyanosis clubbing. LUE larger than Right. Trace LE edema.  Neuro: AAOx3, nonfocal  Skin: Without rashes exudates or nodules  Psych: Appropriate  Discharge Instructions      Discharge Instructions    Discharge instructions    Complete by:  As directed   Patient will be discharged to home.  Patient will need to follow up with primary care provider within one week of discharge.  Follow up with neurology and rheumatology as an outpatient.  Patient should continue medications as prescribed.  Patient should follow a heart healthy/carb modified diet.  Patient advised not to drive or operate heavy machinery, mountain climbing, deep sea diving.  PT outpatient for left arm lymphedema.            Medication List    STOP taking these medications        sulfamethoxazole-trimethoprim 800-160 MG tablet  Commonly known as:  BACTRIM DS,SEPTRA DS      TAKE these medications        acyclovir 400 MG tablet  Commonly known as:  ZOVIRAX  Take 400 mg by mouth 2 (two) times daily as needed (for 5 days).     ferrous sulfate 325 (65 FE) MG tablet  Take 325  mg by mouth daily with breakfast.     lisinopril 20 MG tablet  Commonly known as:  PRINIVIL,ZESTRIL  Take 20 mg by mouth daily.     Lysine 1000 MG Tabs  Take 1,000 mg by mouth daily.     MAGNESIUM PO  Take 1 tablet by mouth daily.     Melatonin 10 MG Tabs  Take 10 mg by mouth at bedtime.     metFORMIN 500 MG 24 hr tablet  Commonly known as:  GLUCOPHAGE-XR  Take 2,000 mg by mouth every evening.     omeprazole 20 MG capsule  Commonly known as:  PRILOSEC  Take 20 mg by mouth daily.     phenazopyridine 200 MG tablet  Commonly known as:  PYRIDIUM  Take 200 mg by mouth daily.     Vitamin D3 2000 units capsule  Take 2,000 Units by mouth daily.     VITAMIN E PO  Take 1 tablet by mouth daily.     zolpidem 5 MG tablet  Commonly known as:  AMBIEN  Take 5 mg by mouth at bedtime.       Allergies  Allergen Reactions  . Hydrocodone Itching  . Other Itching    Ranch dressing ingredient  . Pravastatin Other (See Comments)    Muscle aches and pains  . Sulfa Antibiotics Other (See Comments)    Heaviness in arms   Follow-up Information    Follow  up with [facility], [place]. Schedule an appointment as soon as possible for a visit in 1 week.   Specialty:  Family Medicine   Why:  Hospital follow up   Contact information:   [place], [place] [address] [phone]       Follow up with [facility]. Call in 2 weeks.   Specialty:  Neurology   Why:  Establish care, syncopal events   Contact information:   [address] [place] [phone]       The results of significant diagnostics from this hospitalization (including imaging, microbiology, ancillary and laboratory) are listed below for reference.    Significant Diagnostic Studies: Dg Chest 2 View  05/26/2015  CLINICAL DATA:  Chest pain with left arm heaviness EXAM: CHEST  2 VIEW COMPARISON:  04/24/2015 FINDINGS: Normal heart size and mediastinal  contours. No acute infiltrate or edema. No effusion or pneumothorax. No acute osseous findings. Cholecystectomy IMPRESSION: No evidence of active disease. Electronically Signed   By: Monte Fantasia M.D.   On: 05/26/2015 13:55   Ct Angio Chest Pe W/cm &/or Wo Cm  05/26/2015  EXAM: CT ANGIOGRAPHY CHEST WITH CONTRAST TECHNIQUE: Multidetector CT imaging of the chest was performed using the standard protocol during bolus administration of intravenous contrast. Multiplanar CT image reconstructions and MIPs were obtained to evaluate the vascular anatomy. CONTRAST:  167m OMNIPAQUE IOHEXOL 350 MG/ML SOLN COMPARISON:  None. FINDINGS: Mediastinum/Nodes: Sub cm right thyroid nodule, low attenuation without any complicating features. No pericardial effusion. No significant hilar or mediastinal adenopathy. Thoracic aorta shows no dissection or dilatation. No filling defects in the pulmonary arterial system. Lungs/Pleura: Lungs are clear.  No pleural effusion. Upper abdomen: No acute abnormalities Musculoskeletal: No acute abnormality Review of the MIP images confirms the above findings. IMPRESSION: No acute findings.  No evidence of pulmonary embolism. Electronically Signed   By: RSkipper ClicheM.D.   On: 05/26/2015 18:00   Mr BJeri CosWNLContrast  05/27/2015  CLINICAL DATA:  50year old female with syncope and abnormal extremity heaviness for several days. Recent unintentional weight gain. Initial encounter. EXAM: MRI HEAD WITHOUT AND WITH CONTRAST TECHNIQUE: Multiplanar, multiecho pulse sequences of the brain and surrounding structures were obtained without and with intravenous contrast. CONTRAST:  283mMULTIHANCE GADOBENATE DIMEGLUMINE 529 MG/ML IV SOLN COMPARISON:  None. FINDINGS: Cerebral volume is within normal limits. No restricted diffusion to suggest acute infarction. No midline shift, mass effect, evidence of mass lesion, ventriculomegaly, extra-axial collection or acute intracranial hemorrhage. Cervicomedullary  junction and pituitary are within normal limits. Major intracranial vascular flow voids are within normal limits. GrPearline Cablesnd white matter signal is within normal limits throughout the brain. No abnormal enhancement identified. Visible internal auditory structures appear normal. Mastoids are clear. Mild paranasal sinus mucosal thickening. Negative orbit and scalp soft tissues. Hyperostosis of the calvarium. Visualized bone marrow signal is within normal limits. Negative visualized cervical spine. IMPRESSION: 1.  Normal MRI appearance of the brain. 2. Mild to moderate paranasal sinus inflammation. Electronically Signed   By: H Genevie Ann.D.   On: 05/27/2015 16:05    Microbiology: Recent Results (from the past 240 hour(s))  Urine culture     Status: None   Collection Time: 05/26/15  6:03 PM  Result Value Ref Range Status   Specimen Description URINE, CLEAN CATCH  Final   Special Requests NONE  Final   Culture MULTIPLE SPECIES PRESENT, SUGGEST RECOLLECTION  Final   Report Status 05/27/2015  FINAL  Final     Labs: Basic Metabolic Panel:  Recent Labs Lab 05/26/15 1258 05/27/15 0610 05/28/15 0453  NA 139 136 137  K 4.8 4.2 4.0  CL 103 104 108  CO2 24 21* 22  GLUCOSE 188* 137* 146*  BUN '19 15 11  ' CREATININE 0.82 0.73 0.67  CALCIUM 9.8 9.4 8.5*  MG  --  1.5*  --    Liver Function Tests: No results for input(s): AST, ALT, ALKPHOS, BILITOT, PROT, ALBUMIN in the last 168 hours. No results for input(s): LIPASE, AMYLASE in the last 168 hours. No results for input(s): AMMONIA in the last 168 hours. CBC:  Recent Labs Lab 05/26/15 1258 05/28/15 0453  WBC 9.3 5.0  HGB 14.2 12.3  HCT 43.6 37.3  MCV 86.7 86.9  PLT 288 193   Cardiac Enzymes:  Recent Labs Lab 05/27/15 0610  CKTOTAL 35*   BNP: BNP (last 3 results) No results for input(s): BNP in the last 8760 hours.  ProBNP (last 3 results) No results for input(s): PROBNP in the last 8760 hours.  CBG:  Recent Labs Lab  05/27/15 1052 05/27/15 1621 05/27/15 2027 05/28/15 0542 05/28/15 1230  GLUCAP 156* 127* 131* 127* 120*       Signed:  Georgeanne Frankland  [facility] 05/28/2015, 2:18 PM

## 2015-05-28 NOTE — Evaluation (Signed)
Physical Therapy Evaluation Patient Details Name: Elizabeth Schneider MRN: [id number] DOB: 02-08-1966 Today's Date: 05/28/2015   History of Present Illness  50 year old female with a past medical history of diabetes mellitus type 2 and obesity; who presents with complaints of heaviness. atient states symptoms started approximately 5 days ago. Describes symptoms as a feeling of heaviness in her chest, bilateral arms, and most recently jaw yesterday.  Clinical Impression  Patient demonstrates functional mobility at levels appropriate for d/c home. Educated patient extensively regarding home exercises, energy conservation, activity management and edema control. Recommend follow up with outpatient lymphedema therapist for management of LUE. No further acute PT needs, will sign off.    Follow Up Recommendations Outpatient PT (lymphedema treatment LUE recommended)    Equipment Recommendations  None recommended by PT    Recommendations for Other Services       Precautions / Restrictions Precautions Precautions: Fall      Mobility  Bed Mobility Overal bed mobility: Independent                Transfers Overall transfer level: Independent                  Ambulation/Gait Ambulation/Gait assistance: Independent Ambulation Distance (Feet): 160 Feet Assistive device:  (pushing IV pole)   Gait velocity: decreased   General Gait Details: steady, no evidence of SOB or syncope noted  Stairs            Wheelchair Mobility    Modified Rankin (Stroke Patients Only)       Balance Overall balance assessment: No apparent balance deficits (not formally assessed)                                           Pertinent Vitals/Pain Pain Assessment: No/denies pain    Home Living Family/patient expects to be discharged to:: Private residence Living Arrangements: Spouse/significant other;Children;Other relatives Available Help at Discharge: Family Type of  Home: House Home Access: Stairs to enter   Technical brewer of Steps: 2 Home Layout: One level Home Equipment: Cane - single point      Prior Function Level of Independence: Independent               Hand Dominance   Dominant Hand: Right    Extremity/Trunk Assessment   Upper Extremity Assessment: LUE deficits/detail       LUE Deficits / Details: increased LUE edema, pitting below axilary region   Lower Extremity Assessment: Generalized weakness         Communication   Communication: No difficulties  Cognition Arousal/Alertness: Awake/alert Behavior During Therapy: WFL for tasks assessed/performed Overall Cognitive Status: Within Functional Limits for tasks assessed                      General Comments General comments (skin integrity, edema, etc.): educated patient at length regarding DOE, energy conservation, activity, home exercises and edema control.     Exercises        Assessment/Plan    PT Assessment    PT Diagnosis Difficulty walking;Generalized weakness   PT Problem List    PT Treatment Interventions     PT Goals (Current goals can be found in the Care Plan section) Acute Rehab PT Goals Patient Stated Goal: to go home PT Goal Formulation: All assessment and education complete, DC therapy    Frequency  Barriers to discharge        Co-evaluation               End of Session Equipment Utilized During Treatment: Gait belt Activity Tolerance: Patient tolerated treatment well Patient left: in bed;with call bell/phone within reach Nurse Communication: Mobility status         Time: 1346-1410 PT Time Calculation (min) (ACUTE ONLY): 24 min   Charges:   PT Evaluation $PT Eval Moderate Complexity: 1 Procedure PT Treatments $Self Care/Home Management: 8-22   PT G CodesDuncan Dull 06/04/15, 2:34 PM Alben Deeds, [place] DPT  [phone]

## 2015-05-28 NOTE — Discharge Instructions (Signed)

## 2015-05-28 NOTE — Progress Notes (Signed)
Talked to patient about Outpatient therapy for Lymphedema; choice offered, pt is agreeable to go to [facility] - rehab center called to verify that they can provide the service for Lymphedema and they can but requested that the order be changed to Outpatient OT for upper extremity. Mindi Slicker [facility] [phone]

## 2015-05-28 NOTE — Progress Notes (Signed)
VASCULAR LAB PRELIMINARY  PRELIMINARY  PRELIMINARY  PRELIMINARY  Carotid duplex  completed.    Preliminary report:  Bilateral:  1-39% ICA stenosis.  Vertebral artery flow is antegrade.      Elizabeth Schneider, RVT 05/28/2015, 12:06 PM

## 2015-05-28 NOTE — Progress Notes (Signed)
EEG Completed; Results Pending  

## 2015-06-04 LAB — FANA STAINING PATTERNS: Homogeneous Pattern: 1:80 {titer}

## 2015-06-04 LAB — ANTINUCLEAR ANTIBODIES, IFA: ANA Ab, IFA: POSITIVE — AB

## 2016-05-21 DIAGNOSIS — Z1231 Encounter for screening mammogram for malignant neoplasm of breast: Secondary | ICD-10-CM | POA: Diagnosis not present

## 2016-05-21 DIAGNOSIS — Z13820 Encounter for screening for osteoporosis: Secondary | ICD-10-CM | POA: Diagnosis not present

## 2016-05-21 DIAGNOSIS — Z780 Asymptomatic menopausal state: Secondary | ICD-10-CM | POA: Diagnosis not present

## 2016-05-21 DIAGNOSIS — N912 Amenorrhea, unspecified: Secondary | ICD-10-CM | POA: Diagnosis not present

## 2016-05-21 DIAGNOSIS — E785 Hyperlipidemia, unspecified: Secondary | ICD-10-CM | POA: Diagnosis not present

## 2016-05-21 DIAGNOSIS — E1169 Type 2 diabetes mellitus with other specified complication: Secondary | ICD-10-CM | POA: Diagnosis not present

## 2016-08-20 DIAGNOSIS — N912 Amenorrhea, unspecified: Secondary | ICD-10-CM | POA: Diagnosis not present

## 2016-08-20 DIAGNOSIS — E785 Hyperlipidemia, unspecified: Secondary | ICD-10-CM | POA: Diagnosis not present

## 2016-08-20 DIAGNOSIS — I10 Essential (primary) hypertension: Secondary | ICD-10-CM | POA: Diagnosis not present

## 2016-08-20 DIAGNOSIS — M2550 Pain in unspecified joint: Secondary | ICD-10-CM | POA: Diagnosis not present

## 2016-08-20 DIAGNOSIS — E1169 Type 2 diabetes mellitus with other specified complication: Secondary | ICD-10-CM | POA: Diagnosis not present

## 2016-08-27 DIAGNOSIS — M5412 Radiculopathy, cervical region: Secondary | ICD-10-CM | POA: Diagnosis not present

## 2016-09-03 DIAGNOSIS — E785 Hyperlipidemia, unspecified: Secondary | ICD-10-CM | POA: Diagnosis not present

## 2016-09-03 DIAGNOSIS — E1169 Type 2 diabetes mellitus with other specified complication: Secondary | ICD-10-CM | POA: Diagnosis not present

## 2016-09-20 DIAGNOSIS — R7989 Other specified abnormal findings of blood chemistry: Secondary | ICD-10-CM | POA: Diagnosis not present

## 2016-10-01 DIAGNOSIS — Z1389 Encounter for screening for other disorder: Secondary | ICD-10-CM | POA: Diagnosis not present

## 2016-10-01 DIAGNOSIS — E1169 Type 2 diabetes mellitus with other specified complication: Secondary | ICD-10-CM | POA: Diagnosis not present

## 2016-10-15 DIAGNOSIS — E1169 Type 2 diabetes mellitus with other specified complication: Secondary | ICD-10-CM | POA: Diagnosis not present

## 2016-10-15 DIAGNOSIS — E785 Hyperlipidemia, unspecified: Secondary | ICD-10-CM | POA: Diagnosis not present

## 2016-11-01 DIAGNOSIS — Z1389 Encounter for screening for other disorder: Secondary | ICD-10-CM | POA: Diagnosis not present

## 2016-11-01 DIAGNOSIS — K210 Gastro-esophageal reflux disease with esophagitis: Secondary | ICD-10-CM | POA: Diagnosis not present

## 2016-12-03 DIAGNOSIS — E785 Hyperlipidemia, unspecified: Secondary | ICD-10-CM | POA: Diagnosis not present

## 2016-12-03 DIAGNOSIS — E1169 Type 2 diabetes mellitus with other specified complication: Secondary | ICD-10-CM | POA: Diagnosis not present

## 2016-12-06 DIAGNOSIS — Z1389 Encounter for screening for other disorder: Secondary | ICD-10-CM | POA: Diagnosis not present

## 2017-03-14 DIAGNOSIS — R413 Other amnesia: Secondary | ICD-10-CM | POA: Diagnosis not present

## 2017-03-14 DIAGNOSIS — I10 Essential (primary) hypertension: Secondary | ICD-10-CM | POA: Diagnosis not present

## 2017-03-14 DIAGNOSIS — R300 Dysuria: Secondary | ICD-10-CM | POA: Diagnosis not present

## 2017-03-14 DIAGNOSIS — E1169 Type 2 diabetes mellitus with other specified complication: Secondary | ICD-10-CM | POA: Diagnosis not present

## 2017-03-14 DIAGNOSIS — Z79899 Other long term (current) drug therapy: Secondary | ICD-10-CM | POA: Diagnosis not present

## 2017-03-14 DIAGNOSIS — E785 Hyperlipidemia, unspecified: Secondary | ICD-10-CM | POA: Diagnosis not present

## 2017-04-15 DIAGNOSIS — E1169 Type 2 diabetes mellitus with other specified complication: Secondary | ICD-10-CM | POA: Diagnosis not present

## 2017-04-15 DIAGNOSIS — R531 Weakness: Secondary | ICD-10-CM | POA: Diagnosis not present

## 2017-04-15 DIAGNOSIS — E785 Hyperlipidemia, unspecified: Secondary | ICD-10-CM | POA: Diagnosis not present

## 2017-05-05 DIAGNOSIS — E559 Vitamin D deficiency, unspecified: Secondary | ICD-10-CM | POA: Diagnosis not present

## 2017-05-05 DIAGNOSIS — E1169 Type 2 diabetes mellitus with other specified complication: Secondary | ICD-10-CM | POA: Diagnosis not present

## 2017-05-05 DIAGNOSIS — Z0000 Encounter for general adult medical examination without abnormal findings: Secondary | ICD-10-CM | POA: Diagnosis not present

## 2017-05-12 DIAGNOSIS — Z1211 Encounter for screening for malignant neoplasm of colon: Secondary | ICD-10-CM | POA: Diagnosis not present

## 2017-05-23 DIAGNOSIS — E1169 Type 2 diabetes mellitus with other specified complication: Secondary | ICD-10-CM | POA: Diagnosis not present

## 2017-05-23 DIAGNOSIS — E785 Hyperlipidemia, unspecified: Secondary | ICD-10-CM | POA: Diagnosis not present

## 2017-06-02 DIAGNOSIS — K5900 Constipation, unspecified: Secondary | ICD-10-CM | POA: Diagnosis not present

## 2017-06-02 DIAGNOSIS — R195 Other fecal abnormalities: Secondary | ICD-10-CM | POA: Diagnosis not present

## 2017-06-02 DIAGNOSIS — Z800 Family history of malignant neoplasm of digestive organs: Secondary | ICD-10-CM | POA: Diagnosis not present

## 2017-06-20 DIAGNOSIS — Z1211 Encounter for screening for malignant neoplasm of colon: Secondary | ICD-10-CM | POA: Diagnosis not present

## 2017-06-20 DIAGNOSIS — K5730 Diverticulosis of large intestine without perforation or abscess without bleeding: Secondary | ICD-10-CM | POA: Diagnosis not present

## 2017-06-20 DIAGNOSIS — Z800 Family history of malignant neoplasm of digestive organs: Secondary | ICD-10-CM | POA: Diagnosis not present

## 2017-06-20 DIAGNOSIS — K635 Polyp of colon: Secondary | ICD-10-CM | POA: Diagnosis not present

## 2017-08-29 DIAGNOSIS — M129 Arthropathy, unspecified: Secondary | ICD-10-CM | POA: Diagnosis not present

## 2017-08-29 DIAGNOSIS — Z0000 Encounter for general adult medical examination without abnormal findings: Secondary | ICD-10-CM | POA: Diagnosis not present

## 2017-09-12 DIAGNOSIS — E119 Type 2 diabetes mellitus without complications: Secondary | ICD-10-CM | POA: Diagnosis not present

## 2017-09-12 DIAGNOSIS — R413 Other amnesia: Secondary | ICD-10-CM | POA: Diagnosis not present

## 2017-09-12 DIAGNOSIS — E7800 Pure hypercholesterolemia, unspecified: Secondary | ICD-10-CM | POA: Diagnosis not present

## 2017-10-10 DIAGNOSIS — N952 Postmenopausal atrophic vaginitis: Secondary | ICD-10-CM | POA: Diagnosis not present

## 2017-11-07 DIAGNOSIS — Z780 Asymptomatic menopausal state: Secondary | ICD-10-CM | POA: Diagnosis not present

## 2017-11-07 DIAGNOSIS — D259 Leiomyoma of uterus, unspecified: Secondary | ICD-10-CM | POA: Diagnosis not present

## 2017-12-12 DIAGNOSIS — E7800 Pure hypercholesterolemia, unspecified: Secondary | ICD-10-CM | POA: Diagnosis not present

## 2017-12-12 DIAGNOSIS — E1165 Type 2 diabetes mellitus with hyperglycemia: Secondary | ICD-10-CM | POA: Diagnosis not present

## 2017-12-12 DIAGNOSIS — R5383 Other fatigue: Secondary | ICD-10-CM | POA: Diagnosis not present

## 2017-12-19 DIAGNOSIS — E7800 Pure hypercholesterolemia, unspecified: Secondary | ICD-10-CM | POA: Diagnosis not present

## 2017-12-19 DIAGNOSIS — I10 Essential (primary) hypertension: Secondary | ICD-10-CM | POA: Diagnosis not present

## 2017-12-19 DIAGNOSIS — E119 Type 2 diabetes mellitus without complications: Secondary | ICD-10-CM | POA: Diagnosis not present

## 2018-03-15 DIAGNOSIS — I10 Essential (primary) hypertension: Secondary | ICD-10-CM | POA: Diagnosis not present

## 2018-03-15 DIAGNOSIS — M129 Arthropathy, unspecified: Secondary | ICD-10-CM | POA: Diagnosis not present

## 2018-03-15 DIAGNOSIS — E7800 Pure hypercholesterolemia, unspecified: Secondary | ICD-10-CM | POA: Diagnosis not present

## 2018-03-15 DIAGNOSIS — E119 Type 2 diabetes mellitus without complications: Secondary | ICD-10-CM | POA: Diagnosis not present

## 2018-03-15 DIAGNOSIS — R768 Other specified abnormal immunological findings in serum: Secondary | ICD-10-CM | POA: Diagnosis not present

## 2018-04-12 DIAGNOSIS — Z86010 Personal history of colonic polyps: Secondary | ICD-10-CM | POA: Diagnosis not present

## 2018-04-12 DIAGNOSIS — Z800 Family history of malignant neoplasm of digestive organs: Secondary | ICD-10-CM | POA: Diagnosis not present

## 2018-04-12 DIAGNOSIS — Z1211 Encounter for screening for malignant neoplasm of colon: Secondary | ICD-10-CM | POA: Diagnosis not present

## 2018-07-20 DIAGNOSIS — N390 Urinary tract infection, site not specified: Secondary | ICD-10-CM | POA: Diagnosis not present

## 2018-07-20 DIAGNOSIS — N951 Menopausal and female climacteric states: Secondary | ICD-10-CM | POA: Diagnosis not present

## 2018-07-20 DIAGNOSIS — M25532 Pain in left wrist: Secondary | ICD-10-CM | POA: Diagnosis not present

## 2018-07-20 DIAGNOSIS — E119 Type 2 diabetes mellitus without complications: Secondary | ICD-10-CM | POA: Diagnosis not present

## 2018-07-20 DIAGNOSIS — E7800 Pure hypercholesterolemia, unspecified: Secondary | ICD-10-CM | POA: Diagnosis not present

## 2018-07-20 DIAGNOSIS — R300 Dysuria: Secondary | ICD-10-CM | POA: Diagnosis not present

## 2018-07-20 DIAGNOSIS — I10 Essential (primary) hypertension: Secondary | ICD-10-CM | POA: Diagnosis not present

## 2018-09-09 ENCOUNTER — Other Ambulatory Visit: Payer: Self-pay

## 2018-09-09 ENCOUNTER — Emergency Department (HOSPITAL_COMMUNITY): Payer: 59

## 2018-09-09 ENCOUNTER — Encounter (HOSPITAL_COMMUNITY): Payer: Self-pay | Admitting: Emergency Medicine

## 2018-09-09 ENCOUNTER — Inpatient Hospital Stay (HOSPITAL_COMMUNITY)
Admission: EM | Admit: 2018-09-09 | Discharge: 2018-09-11 | DRG: 177 | Disposition: A | Discharge status: Home / Self Care | Payer: 59 | Attending: Internal Medicine | Admitting: Internal Medicine

## 2018-09-09 DIAGNOSIS — G8929 Other chronic pain: Secondary | ICD-10-CM | POA: Diagnosis not present

## 2018-09-09 DIAGNOSIS — K219 Gastro-esophageal reflux disease without esophagitis: Secondary | ICD-10-CM | POA: Diagnosis not present

## 2018-09-09 DIAGNOSIS — N390 Urinary tract infection, site not specified: Secondary | ICD-10-CM | POA: Diagnosis not present

## 2018-09-09 DIAGNOSIS — I10 Essential (primary) hypertension: Secondary | ICD-10-CM | POA: Diagnosis present

## 2018-09-09 DIAGNOSIS — U071 COVID-19: Secondary | ICD-10-CM | POA: Diagnosis not present

## 2018-09-09 DIAGNOSIS — Z882 Allergy status to sulfonamides status: Secondary | ICD-10-CM

## 2018-09-09 DIAGNOSIS — Z885 Allergy status to narcotic agent status: Secondary | ICD-10-CM

## 2018-09-09 DIAGNOSIS — M545 Low back pain: Secondary | ICD-10-CM | POA: Diagnosis present

## 2018-09-09 DIAGNOSIS — E669 Obesity, unspecified: Secondary | ICD-10-CM | POA: Diagnosis not present

## 2018-09-09 DIAGNOSIS — M797 Fibromyalgia: Secondary | ICD-10-CM | POA: Diagnosis present

## 2018-09-09 DIAGNOSIS — J1289 Other viral pneumonia: Secondary | ICD-10-CM | POA: Diagnosis present

## 2018-09-09 DIAGNOSIS — Z79899 Other long term (current) drug therapy: Secondary | ICD-10-CM | POA: Diagnosis not present

## 2018-09-09 DIAGNOSIS — Z79890 Hormone replacement therapy (postmenopausal): Secondary | ICD-10-CM

## 2018-09-09 DIAGNOSIS — J189 Pneumonia, unspecified organism: Secondary | ICD-10-CM

## 2018-09-09 DIAGNOSIS — M549 Dorsalgia, unspecified: Secondary | ICD-10-CM | POA: Diagnosis present

## 2018-09-09 DIAGNOSIS — R0609 Other forms of dyspnea: Secondary | ICD-10-CM

## 2018-09-09 DIAGNOSIS — Z888 Allergy status to other drugs, medicaments and biological substances status: Secondary | ICD-10-CM

## 2018-09-09 DIAGNOSIS — J181 Lobar pneumonia, unspecified organism: Secondary | ICD-10-CM | POA: Diagnosis not present

## 2018-09-09 DIAGNOSIS — E1169 Type 2 diabetes mellitus with other specified complication: Secondary | ICD-10-CM | POA: Diagnosis not present

## 2018-09-09 DIAGNOSIS — Z7984 Long term (current) use of oral hypoglycemic drugs: Secondary | ICD-10-CM

## 2018-09-09 DIAGNOSIS — Z9049 Acquired absence of other specified parts of digestive tract: Secondary | ICD-10-CM | POA: Diagnosis not present

## 2018-09-09 DIAGNOSIS — E119 Type 2 diabetes mellitus without complications: Secondary | ICD-10-CM | POA: Diagnosis present

## 2018-09-09 DIAGNOSIS — Z9851 Tubal ligation status: Secondary | ICD-10-CM

## 2018-09-09 DIAGNOSIS — Z6841 Body Mass Index (BMI) 40.0 and over, adult: Secondary | ICD-10-CM | POA: Diagnosis not present

## 2018-09-09 HISTORY — DX: Systemic lupus erythematosus, unspecified: M32.9

## 2018-09-09 HISTORY — DX: Reserved for concepts with insufficient information to code with codable children: IMO0002

## 2018-09-09 LAB — URINALYSIS, ROUTINE W REFLEX MICROSCOPIC
Bilirubin Urine: NEGATIVE
Glucose, UA: NEGATIVE mg/dL
Ketones, ur: NEGATIVE mg/dL
Nitrite: NEGATIVE
Protein, ur: NEGATIVE mg/dL
Specific Gravity, Urine: 1.006 (ref 1.005–1.030)
pH: 6 (ref 5.0–8.0)

## 2018-09-09 LAB — BASIC METABOLIC PANEL
Anion gap: 12 (ref 5–15)
BUN: 9 mg/dL (ref 6–20)
CO2: 22 mmol/L (ref 22–32)
Calcium: 8.9 mg/dL (ref 8.9–10.3)
Chloride: 103 mmol/L (ref 98–111)
Creatinine, Ser: 0.96 mg/dL (ref 0.44–1.00)
GFR calc Af Amer: >60 mL/min (ref >60)
GFR calc non Af Amer: >60 mL/min (ref >60)
Glucose, Bld: 108 mg/dL — ABNORMAL HIGH (ref 70–99)
Potassium: 4.1 mmol/L (ref 3.5–5.1)
Sodium: 137 mmol/L (ref 135–145)

## 2018-09-09 LAB — POCT I-STAT EG7
Acid-Base Excess: 2 mmol/L (ref 0.0–2.0)
Bicarbonate: 26.3 mmol/L (ref 20.0–28.0)
Calcium, Ion: 1.01 mmol/L — ABNORMAL LOW (ref 1.15–1.40)
HCT: 37 % (ref 36.0–46.0)
Hemoglobin: 12.6 g/dL (ref 12.0–15.0)
O2 Saturation: 90 %
Potassium: 6.1 mmol/L — ABNORMAL HIGH (ref 3.5–5.1)
Sodium: 138 mmol/L (ref 135–145)
TCO2: 27 mmol/L (ref 22–32)
pCO2, Ven: 39 mmHg — ABNORMAL LOW (ref 44.0–60.0)
pH, Ven: 7.436 — ABNORMAL HIGH (ref 7.250–7.430)
pO2, Ven: 56 mmHg — ABNORMAL HIGH (ref 32.0–45.0)

## 2018-09-09 LAB — CBC WITH DIFFERENTIAL/PLATELET
Abs Immature Granulocytes: 0.02 10*3/uL (ref 0.00–0.07)
Basophils Absolute: 0 10*3/uL (ref 0.0–0.1)
Basophils Relative: 0 %
Eosinophils Absolute: 0 10*3/uL (ref 0.0–0.5)
Eosinophils Relative: 1 %
HCT: 43.1 % (ref 36.0–46.0)
Hemoglobin: 13.8 g/dL (ref 12.0–15.0)
Immature Granulocytes: 1 %
Lymphocytes Relative: 44 %
Lymphs Abs: 1.5 10*3/uL (ref 0.7–4.0)
MCH: 27.3 pg (ref 26.0–34.0)
MCHC: 32 g/dL (ref 30.0–36.0)
MCV: 85.2 fL (ref 80.0–100.0)
Monocytes Absolute: 0.4 10*3/uL (ref 0.1–1.0)
Monocytes Relative: 11 %
Neutro Abs: 1.5 10*3/uL — ABNORMAL LOW (ref 1.7–7.7)
Neutrophils Relative %: 43 %
Platelets: 157 10*3/uL (ref 150–400)
RBC: 5.06 MIL/uL (ref 3.87–5.11)
RDW: 12.7 % (ref 11.5–15.5)
WBC: 3.4 10*3/uL — ABNORMAL LOW (ref 4.0–10.5)
nRBC: 0 % (ref 0.0–0.2)

## 2018-09-09 LAB — LACTIC ACID, PLASMA
Lactic Acid, Venous: 0.9 mmol/L (ref 0.5–1.9)
Lactic Acid, Venous: 0.8 mmol/L (ref 0.5–1.9)

## 2018-09-09 LAB — GLUCOSE, CAPILLARY: Glucose-Capillary: 90 mg/dL (ref 70–99)

## 2018-09-09 LAB — TROPONIN I: Troponin I: <0.03 ng/mL (ref <0.03)

## 2018-09-09 MED ORDER — ENOXAPARIN SODIUM 40 MG/0.4ML ~~LOC~~ SOLN: 40.0000 mg | Freq: Two times a day (BID) | SUBCUTANEOUS | Status: DC

## 2018-09-09 MED ORDER — ZINC SULFATE 220 (50 ZN) MG PO CAPS
220.0000 mg | ORAL_CAPSULE | Freq: Every day | ORAL | Status: DC
  Administered 2018-09-10 – 2018-09-11 (×2): 220 mg via ORAL
  Filled 2018-09-09 (×2): qty 1

## 2018-09-09 MED ORDER — SODIUM CHLORIDE 0.9 % IV SOLN
2.0000 g | INTRAVENOUS | Status: DC
  Administered 2018-09-10 – 2018-09-11 (×2): 2 g via INTRAVENOUS
  Filled 2018-09-09: qty 2
  Filled 2018-09-09: qty 20

## 2018-09-09 MED ORDER — TRAZODONE HCL 50 MG PO TABS
100.0000 mg | ORAL_TABLET | Freq: Every day | ORAL | Status: DC
  Administered 2018-09-09 – 2018-09-10 (×2): 100 mg via ORAL
  Filled 2018-09-09 (×2): qty 2

## 2018-09-09 MED ORDER — SODIUM CHLORIDE 0.9 % IV SOLN
500.0000 mg | Freq: Once | INTRAVENOUS | Status: AC
  Administered 2018-09-09: 500 mg via INTRAVENOUS
  Filled 2018-09-09: qty 500

## 2018-09-09 MED ORDER — SODIUM CHLORIDE 0.9 % IV SOLN
1.0000 g | INTRAVENOUS | Status: DC
  Filled 2018-09-09: qty 10

## 2018-09-09 MED ORDER — LACTATED RINGERS IV BOLUS
1000.0000 mL | Freq: Once | INTRAVENOUS | Status: AC
  Administered 2018-09-09: 1000 mL via INTRAVENOUS

## 2018-09-09 MED ORDER — BUPROPION HCL ER (XL) 300 MG PO TB24
450.0000 mg | ORAL_TABLET | Freq: Every day | ORAL | Status: DC
  Filled 2018-09-09: qty 1

## 2018-09-09 MED ORDER — VITAMIN C 500 MG PO TABS
500.0000 mg | ORAL_TABLET | Freq: Every day | ORAL | Status: DC
  Administered 2018-09-10 – 2018-09-11 (×2): 500 mg via ORAL
  Filled 2018-09-09 (×2): qty 1

## 2018-09-09 MED ORDER — SODIUM CHLORIDE 0.9 % IV SOLN
1.0000 g | Freq: Once | INTRAVENOUS | Status: AC
  Administered 2018-09-09: 1 g via INTRAVENOUS
  Filled 2018-09-09: qty 10

## 2018-09-09 MED ORDER — BUPROPION HCL ER (XL) 150 MG PO TB24
450.0000 mg | ORAL_TABLET | Freq: Every day | ORAL | Status: DC
  Administered 2018-09-10 (×2): 450 mg via ORAL
  Filled 2018-09-09 (×2): qty 3

## 2018-09-09 MED ORDER — SODIUM CHLORIDE 0.9 % IV SOLN
500.0000 mg | INTRAVENOUS | Status: DC
  Administered 2018-09-10: 500 mg via INTRAVENOUS
  Filled 2018-09-09 (×2): qty 500

## 2018-09-09 MED ORDER — ALBUTEROL SULFATE HFA 108 (90 BASE) MCG/ACT IN AERS
2.0000 | INHALATION_SPRAY | Freq: Four times a day (QID) | RESPIRATORY_TRACT | Status: DC
  Administered 2018-09-10: 2 via RESPIRATORY_TRACT
  Filled 2018-09-09: qty 6.7

## 2018-09-09 MED ORDER — ACETAMINOPHEN 325 MG PO TABS: 650.0000 mg | ORAL_TABLET | Freq: Four times a day (QID) | ORAL | Status: DC | PRN

## 2018-09-09 MED ORDER — INSULIN ASPART 100 UNIT/ML ~~LOC~~ SOLN: 0.0000 [IU] | Freq: Three times a day (TID) | SUBCUTANEOUS | Status: DC

## 2018-09-09 MED ORDER — FLUCONAZOLE 150 MG PO TABS
150.0000 mg | ORAL_TABLET | Freq: Once | ORAL | Status: AC
  Administered 2018-09-09: 150 mg via ORAL
  Filled 2018-09-09: qty 1

## 2018-09-09 NOTE — ED Notes (Signed)
[facility] called for transport @ 6:38 according to [facility]

## 2018-09-09 NOTE — ED Notes (Addendum)
ED TO INPATIENT HANDOFF REPORT  ED Nurse Name and Phone #: Gwyndolyn Saxon [phone]  S Name/Age/Gender Elizabeth Schneider 53 y.o. female Room/Bed: 023C/023C  Code Status   Code Status: Prior  Home/SNF/Other Home Patient oriented to: self, place, time and situation Is this baseline? Yes   Triage Complete: Triage complete  Chief Complaint SOB; COVID positive  Triage Note Patient arrives POV stating she was called last Tuesday that she tested positive for covid. Patient states she has had fevers on and off. Patient states onset of last night having pain in her throat and upper chest "feels like I am breathing in cold air."    Allergies Allergies  Allergen Reactions  . Hydrocodone Itching  . Other Itching    Ranch dressing ingredient  . Pravastatin Other (See Comments)    Muscle aches and pains  . Sulfa Antibiotics Other (See Comments)    Heaviness in arms    Level of Care/Admitting Diagnosis ED Disposition    ED Disposition Condition [facility] Area: [place] [[id number]]  Level of Care: Telemetry [5]  Covid Evaluation: Confirmed COVID Positive  Isolation Risk Level: Low Risk/Droplet (Less than 4L Early supplementation)  Diagnosis: COVID-19 virus detected [[id number]]  Admitting Physician: Elwyn Reach [[id number]]  Attending Physician: Elwyn Reach [[id number]]  Estimated length of stay: past midnight tomorrow  Certification:: I certify this patient will need inpatient services for at least 2 midnights  PT Class (Do Not Modify): Inpatient [101]  PT Acc Code (Do Not Modify): Private [1]       B Medical/Surgery History Past Medical History:  Diagnosis Date  . Anxiety   . Chronic back pain    "mid and lower" (05/27/2015)  . Chronic bronchitis ([place])   . Depression   . Fibromyalgia   . GERD (gastroesophageal reflux disease)   . Heart murmur    "clicking murmur"  . Hypertension   . Lupus ([place])   . Obesity   . Type II diabetes  mellitus ([place])    Past Surgical History:  Procedure Laterality Date  . APPENDECTOMY  ~ 1988  . [place]; 1987; 1991  . ENDOMETRIAL BIOPSY  2016  . LAPAROSCOPIC CHOLECYSTECTOMY  2000  . TUBAL LIGATION  1991     A IV Location/Drains/Wounds Patient Lines/Drains/Airways Status   Active Line/Drains/Airways    Name:   Placement date:   Placement time:   Site:   Days:   Peripheral IV 09/09/18 Left Hand   09/09/18    1558    Hand   less than 1   Peripheral IV 09/09/18 Left Antecubital   09/09/18    1750    Antecubital   less than 1          Intake/Output Last 24 hours  Intake/Output Summary (Last 24 hours) at 09/09/2018 1943 Last data filed at 09/09/2018 1938 Gross per 24 hour  Intake 1100 ml  Output -  Net 1100 ml    Labs/Imaging Results for orders placed or performed during the hospital encounter of 09/09/18 (from the past 48 hour(s))  CBC with Differential/Platelet     Status: Abnormal   Collection Time: 09/09/18  3:57 PM  Result Value Ref Range   WBC 3.4 (L) 4.0 - 10.5 K/uL   RBC 5.06 3.87 - 5.11 MIL/uL   Hemoglobin 13.8 12.0 - 15.0 g/dL   HCT 43.1 36.0 - 46.0 %   MCV 85.2 80.0 - 100.0 fL  MCH 27.3 26.0 - 34.0 pg   MCHC 32.0 30.0 - 36.0 g/dL   RDW 12.7 11.5 - 15.5 %   Platelets 157 150 - 400 K/uL   nRBC 0.0 0.0 - 0.2 %   Neutrophils Relative % 43 %   Neutro Abs 1.5 (L) 1.7 - 7.7 K/uL   Lymphocytes Relative 44 %   Lymphs Abs 1.5 0.7 - 4.0 K/uL   Monocytes Relative 11 %   Monocytes Absolute 0.4 0.1 - 1.0 K/uL   Eosinophils Relative 1 %   Eosinophils Absolute 0.0 0.0 - 0.5 K/uL   Basophils Relative 0 %   Basophils Absolute 0.0 0.0 - 0.1 K/uL   Immature Granulocytes 1 %   Abs Immature Granulocytes 0.02 0.00 - 0.07 K/uL    Comment: Performed at [facility] [address], [place], [place] [address]  Basic metabolic panel     Status: Abnormal   Collection Time: 09/09/18  3:57 PM  Result Value Ref Range   Sodium 137 135 - 145 mmol/L   Potassium  4.1 3.5 - 5.1 mmol/L   Chloride 103 98 - 111 mmol/L   CO2 22 22 - 32 mmol/L   Glucose, Bld 108 (H) 70 - 99 mg/dL   BUN 9 6 - 20 mg/dL   Creatinine, Ser 0.96 0.44 - 1.00 mg/dL   Calcium 8.9 8.9 - 10.3 mg/dL   GFR calc non Af Amer >60 >60 mL/min   GFR calc Af Amer >60 >60 mL/min   Anion gap 12 5 - 15    Comment: Performed at [facility] [address], [place], [place] [address]  Troponin I - ONCE - STAT     Status: None   Collection Time: 09/09/18  3:57 PM  Result Value Ref Range   Troponin I <0.03 <0.03 ng/mL    Comment: Performed at [facility], [place] [address] [place], [place] [address]  Urinalysis, Routine w reflex microscopic     Status: Abnormal   Collection Time: 09/09/18  4:04 PM  Result Value Ref Range   Color, Urine YELLOW YELLOW   APPearance HAZY (A) CLEAR   Specific Gravity, Urine 1.006 1.005 - 1.030   pH 6.0 5.0 - 8.0   Glucose, UA NEGATIVE NEGATIVE mg/dL   Hgb urine dipstick SMALL (A) NEGATIVE   Bilirubin Urine NEGATIVE NEGATIVE   Ketones, ur NEGATIVE NEGATIVE mg/dL   Protein, ur NEGATIVE NEGATIVE mg/dL   Nitrite NEGATIVE NEGATIVE   Leukocytes,Ua MODERATE (A) NEGATIVE   RBC / HPF 0-5 0 - 5 RBC/hpf   WBC, UA 6-10 0 - 5 WBC/hpf   Bacteria, UA RARE (A) NONE SEEN   Squamous Epithelial / LPF 11-20 0 - 5   Budding Yeast PRESENT    Hyaline Casts, UA PRESENT     Comment: Performed at [facility], [place] [address] [place], [place] [address]  POCT I-Stat EG7     Status: Abnormal   Collection Time: 09/09/18  4:55 PM  Result Value Ref Range   pH, Ven 7.436 (H) 7.250 - 7.430   pCO2, Ven 39.0 (L) 44.0 - 60.0 mmHg   pO2, Ven 56.0 (H) 32.0 - 45.0 mmHg   Bicarbonate 26.3 20.0 - 28.0 mmol/L   TCO2 27 22 - 32 mmol/L   O2 Saturation 90.0 %   Acid-Base Excess 2.0 0.0 - 2.0 mmol/L   Sodium 138 135 - 145 mmol/L   Potassium 6.1 (H) 3.5 - 5.1 mmol/L  Calcium, Ion 1.01 (L) 1.15 - 1.40 mmol/L   HCT 37.0 36.0 - 46.0 %   Hemoglobin 12.6 12.0 - 15.0 g/dL    Patient temperature HIDE    Sample type VENOUS   Lactic acid, plasma     Status: None   Collection Time: 09/09/18  5:43 PM  Result Value Ref Range   Lactic Acid, Venous 0.9 0.5 - 1.9 mmol/L    Comment: Performed at [facility], [place] [address], [place], [place] [address]   Dg Chest Port 1 View  Result Date: 09/09/2018 CLINICAL DATA:  Shortness of breath. Chest and throat pain. Intermittent fevers. Reportedly positive test for COVID-19. EXAM: PORTABLE CHEST 1 VIEW COMPARISON:  Chest radiographs 05/26/2015 and CTA 05/26/2015 FINDINGS: The cardiomediastinal silhouette is within normal limits. Lung volumes are lower than on the prior radiographs with mild opacity noted in the retrocardiac left lower lobe. No pleural effusion or pneumothorax is identified. No acute osseous abnormality is seen. IMPRESSION: Low lung volumes with mild left lower lobe opacity which may reflect infection or atelectasis. Electronically Signed   By: Logan Bores M.D.   On: 09/09/2018 17:07    Pending Labs Unresulted Labs (From admission, onward)    Start     Ordered   09/09/18 1731  Blood gas, arterial  ONCE - STAT,   R     09/09/18 1730   09/09/18 1719  Lactic acid, plasma  Now then every 2 hours,   STAT     09/09/18 1719   09/09/18 1711  Blood culture (routine x 2)  BLOOD CULTURE X 2,   STAT     09/09/18 1715   09/09/18 1604  Urine culture  ONCE - STAT,   STAT     09/09/18 1604   Signed and Held  HIV antibody (Routine Testing)  Once,   R     Signed and Held   Visual merchandiser and Held  ABO/Rh  Once,   R     Signed and Held   Signed and Held  C-reactive protein  Once,   R     Signed and Held   Visual merchandiser and Held  D-dimer, quantitative (not at [facility])  Once,   R     Signed and Held   Signed and Held  Glucose 6 phosphate dehydrogenase  Once,   R     Signed and Held   Visual merchandiser and Held  Hepatitis B surface antigen  Once,   R     Signed and Held   Signed and Held  Procalcitonin  Once,   R     Signed and Held   Signed and  Held  Lactate dehydrogenase  Once,   R     Signed and Held   Signed and Held  Sedimentation rate  Once,   R     Signed and Held   Signed and Held  CBC with Differential/Platelet  Daily,   R     Signed and Held   Visual merchandiser and Held  Comprehensive metabolic panel  Daily,   R     Signed and Held   Signed and Held  C-reactive protein  Daily,   R     Signed and Held   Signed and Held  D-dimer, quantitative (not at [facility])  Daily,   R     Signed and Held   Signed and Held  Ferritin  Daily,   R     Signed and Held   Signed  and Held  Ferritin  Once,   R     Signed and Held   Signed and Held  Magnesium  Daily,   R     Signed and Held   Signed and Held  Phosphorus  Daily,   R     Signed and Held          Vitals/Pain Today's Vitals   09/09/18 1536 09/09/18 1537 09/09/18 1700 09/09/18 1730  BP: 126/80  108/66 106/70  Pulse:   81 80  Resp:   (!) 21 17  Temp: 99.5 F (37.5 C)     TempSrc: Oral     SpO2:   96% 100%  Weight:  99.8 kg    Height:  5\' 2"  (1.575 m)    PainSc: 3        Isolation Precautions No active isolations  Medications Medications  lactated ringers bolus 1,000 mL (0 mLs Intravenous Stopped 09/09/18 1938)  cefTRIAXone (ROCEPHIN) 1 g in sodium chloride 0.9 % 100 mL IVPB (0 g Intravenous Stopped 09/09/18 1807)  fluconazole (DIFLUCAN) tablet 150 mg (150 mg Oral Given 09/09/18 1728)  azithromycin (ZITHROMAX) 500 mg in sodium chloride 0.9 % 250 mL IVPB (500 mg Intravenous New Bag/Given 09/09/18 1753)    Mobility walks Low fall risk   Focused Assessments Pulmonary Assessment Handoff:  Lung sounds: Bilateral Breath Sounds: Diminished L Breath Sounds: Diminished R Breath Sounds: Diminished O2 Device: Room Air        R Recommendations: See Admitting Provider Note  Report given to: Leanne,RN at [facility] @1945   Additional Notes: Patient a&ox 4 from home with positive covid test done outpatient earlier in the week; Patient has several comobities just as hx  of Lupus, DM, HTN , and chronic bronchitis. Pt has no complaints at this time-Monique,RN

## 2018-09-09 NOTE — ED Triage Notes (Signed)
Patient arrives POV stating she was called last Tuesday that she tested positive for covid. Patient states she has had fevers on and off. Patient states onset of last night having pain in her throat and upper chest "feels like I am breathing in cold air."

## 2018-09-09 NOTE — H&P (Signed)
History and Physical    Elizabeth Schneider OIZ:[id number] DOB: April 16, 1966 DOA: 09/09/2018  I have briefly reviewed the patient's prior medical records in [place]  PCP: Charletta Cousin, MD (Inactive)  Patient coming from: home  Chief Complaint: Shortness of breath  HPI: Elizabeth Schneider is a 53 y.o. female with medical history significant of type 2 diabetes mellitus, hypertension, obesity, who presents to the hospital with chief complaint of shortness of breath and general fatigue.  Patient tells me that about 11 days ago 4/21 she started feeling fatigued, achy all over, her neck was hurting.  She also had a fever of 101.  She continued to feel worse, and on 4/27 sought care and went to [facility] and she tested positive for the novel coronavirus.  She was stable at that time and then she was sent home with supportive treatment. She was home for the past 5 days and continued to feel poorly, with intermittent fevers.  Her fever finally broke off on 5/1 and she was feeling a little bit better however this morning when she woke up she was back being febrile, has had more difficulties breathing, when she takes a deep breath she has pleuritic type chest pain and feels like she is breathing in "cold air".  She has become so fatigued that she is barely able to take a few steps and needs to stop.  She does not have enough energy to even take a shower.  She also complains of a cough which is nonproductive, but this has eased off in the last couple of days and she is not coughing as much now.  She complains of palpitations.  She also has GI symptoms with nausea vomiting and diarrhea, or vomiting has somewhat improved but she still having nausea.  She took an Imodium this morning for diarrhea.  She is not a smoker and drinks rarely  ED Course: in the ED she has a low grade temp nine 9.5, she is tachypneic at rest and with ambulation breathing about 30 times a minute and heart rate went into the 130s and she was  symptomatic.  Chest x-ray showed a left lower lobe infiltrate.  Blood work is fairly unremarkable.  She was given ceftriaxone and azithromycin and we are asked to admit  Review of Systems: As per HPI otherwise 10 point review of systems negative.   Past Medical History:  Diagnosis Date  . Anxiety   . Chronic back pain    "mid and lower" (05/27/2015)  . Chronic bronchitis ([place])   . Depression   . Fibromyalgia   . GERD (gastroesophageal reflux disease)   . Heart murmur    "clicking murmur"  . Hypertension   . Lupus ([place])   . Obesity   . Type II diabetes mellitus ([place])     Past Surgical History:  Procedure Laterality Date  . APPENDECTOMY  ~ 1988  . [place]; 1987; 1991  . ENDOMETRIAL BIOPSY  2016  . LAPAROSCOPIC CHOLECYSTECTOMY  2000  . TUBAL LIGATION  1991     reports that she has never smoked. She has never used smokeless tobacco. She reports current alcohol use. She reports that she does not use drugs.  Allergies  Allergen Reactions  . Hydrocodone Itching  . Other Itching    Ranch dressing ingredient  . Pravastatin Other (See Comments)    Muscle aches and pains  . Sulfa Antibiotics Other (See Comments)    Heaviness in arms  No family history on file.  Prior to Admission medications   Medication Sig Start Date End Date Taking? Authorizing Provider  acyclovir (ZOVIRAX) 400 MG tablet Take 400 mg by mouth 2 (two) times daily as needed (for 5 days).  03/27/15   [provider]  Cholecalciferol (VITAMIN D3) 2000 units capsule Take 2,000 Units by mouth daily.    [provider]  ferrous sulfate 325 (65 FE) MG tablet Take 325 mg by mouth daily with breakfast.    [provider]  lisinopril (PRINIVIL,ZESTRIL) 20 MG tablet Take 20 mg by mouth daily. 05/22/15   [provider]  Lysine 1000 MG TABS Take 1,000 mg by mouth daily.    [provider]  MAGNESIUM PO Take 1 tablet by mouth daily.    [provider]   Melatonin 10 MG TABS Take 10 mg by mouth at bedtime.    [provider]  metFORMIN (GLUCOPHAGE-XR) 500 MG 24 hr tablet Take 2,000 mg by mouth every evening. 05/21/15   [provider]  omeprazole (PRILOSEC) 20 MG capsule Take 20 mg by mouth daily. 06/30/13   [provider]  phenazopyridine (PYRIDIUM) 200 MG tablet Take 200 mg by mouth daily.  05/16/15   [provider]  VITAMIN E PO Take 1 tablet by mouth daily.    [provider]  zolpidem (AMBIEN) 5 MG tablet Take 5 mg by mouth at bedtime. 05/14/15   [provider]    Physical Exam: Vitals:   09/09/18 1536 09/09/18 1537 09/09/18 1700 09/09/18 1730  BP: 126/80  108/66 106/70  Pulse:   81 80  Resp:   (!) 21 17  Temp: 99.5 F (37.5 C)     TempSrc: Oral     SpO2:   96% 100%  Weight:  99.8 kg    Height:  5\' 2"  (1.575 m)      Constitutional: She is in no distress but tachypneic Eyes: PERRL, lids and conjunctivae normal ENMT: Mucous membranes are moist Neck: normal, supple Respiratory: Decreased breath sounds at the bases, faint rhonchi on the left lung field, moves air well, no wheezing, no crackles Cardiovascular: Regular rate and rhythm, no murmurs / rubs / gallops. No extremity edema. 2+ pedal pulses.  Abdomen: no tenderness, no masses palpated. Bowel sounds positive.  Musculoskeletal: no clubbing / cyanosis. Normal muscle tone.  Skin: no rashes, lesions, ulcers. No induration Neurologic: CN 2-12 grossly intact. Strength 5/5 in all 4.  Psychiatric: Normal judgment and insight. Alert and oriented x 3. Normal mood.   Labs on Admission: I have personally reviewed following labs and imaging studies  CBC: Recent Labs  Lab 09/09/18 1557 09/09/18 1655  WBC 3.4*  --   NEUTROABS 1.5*  --   HGB 13.8 12.6  HCT 43.1 37.0  MCV 85.2  --   PLT 157  --    Basic Metabolic Panel: Recent Labs  Lab 09/09/18 1557 09/09/18 1655  NA 137 138  K 4.1 6.1*  CL 103  --   CO2 22  --    GLUCOSE 108*  --   BUN 9  --   CREATININE 0.96  --   CALCIUM 8.9  --    GFR: Estimated Creatinine Clearance: 74 mL/min (by C-G formula based on SCr of 0.96 mg/dL). Liver Function Tests: No results for input(s): AST, ALT, ALKPHOS, BILITOT, PROT, ALBUMIN in the last 168 hours. No results for input(s): LIPASE, AMYLASE in the last 168 hours. No results for input(s):  AMMONIA in the last 168 hours. Coagulation Profile: No results for input(s): INR, PROTIME in the last 168 hours. Cardiac Enzymes: Recent Labs  Lab 09/09/18 1557  TROPONINI <0.03   BNP (last 3 results) No results for input(s): PROBNP in the last 8760 hours. HbA1C: No results for input(s): HGBA1C in the last 72 hours. CBG: No results for input(s): GLUCAP in the last 168 hours. Lipid Profile: No results for input(s): CHOL, HDL, LDLCALC, TRIG, CHOLHDL, LDLDIRECT in the last 72 hours. Thyroid Function Tests: No results for input(s): TSH, T4TOTAL, FREET4, T3FREE, THYROIDAB in the last 72 hours. Anemia Panel: No results for input(s): VITAMINB12, FOLATE, FERRITIN, TIBC, IRON, RETICCTPCT in the last 72 hours. Urine analysis:    Component Value Date/Time   COLORURINE YELLOW 09/09/2018 1604   APPEARANCEUR HAZY (A) 09/09/2018 1604   LABSPEC 1.006 09/09/2018 1604   PHURINE 6.0 09/09/2018 1604   GLUCOSEU NEGATIVE 09/09/2018 1604   HGBUR SMALL (A) 09/09/2018 1604   BILIRUBINUR NEGATIVE 09/09/2018 1604   KETONESUR NEGATIVE 09/09/2018 1604   PROTEINUR NEGATIVE 09/09/2018 1604   NITRITE NEGATIVE 09/09/2018 1604   LEUKOCYTESUR MODERATE (A) 09/09/2018 1604     Radiological Exams on Admission: Dg Chest Port 1 View  Result Date: 09/09/2018 CLINICAL DATA:  Shortness of breath. Chest and throat pain. Intermittent fevers. Reportedly positive test for COVID-19. EXAM: PORTABLE CHEST 1 VIEW COMPARISON:  Chest radiographs 05/26/2015 and CTA 05/26/2015 FINDINGS: The cardiomediastinal silhouette is within normal limits. Lung volumes are  lower than on the prior radiographs with mild opacity noted in the retrocardiac left lower lobe. No pleural effusion or pneumothorax is identified. No acute osseous abnormality is seen. IMPRESSION: Low lung volumes with mild left lower lobe opacity which may reflect infection or atelectasis. Electronically Signed   By: Logan Bores M.D.   On: 09/09/2018 17:07    EKG: Independently reviewed.  Sinus rhythm, QTC 449 Chest x-ray personally reviewed, left lower lobe infiltrate  Assessment/Plan Active Problems:   2019 novel coronavirus disease (COVID-19)   Principal Problem COVID-19 with possible developing bacterial pneumonia -Given chest x-ray findings agree with admission to the hospital, will place patient on IV antibiotics.  We will monitor COVID labs such as CRP, ferritin, d-dimer. -Currently on room air, admit to telemetry -She was given ceftriaxone and azithromycin in the ED, continue -Hold home ACE inhibitor  Active Problems Type 2 diabetes mellitus -Hold home metformin, place patient on sliding scale insulin  Hypertension -Hold lisinopril as above  Obesity -We will need long-term planning for weight loss  DVT prophylaxis: Lovenox Code Status: Full code Family Communication: No family at bedside Disposition Plan: Home when ready Consults called: None   Marzetta Board, MD, PhD [facility]  Contact via [email or web address]  [facility] Office Info P: [phone]  F: [phone]   09/09/2018, 6:07 PM

## 2018-09-09 NOTE — ED Provider Notes (Signed)
[place] EMERGENCY DEPARTMENT Provider Note   CSN: [id number] Arrival date & time: 09/09/18  1507    History   Chief Complaint Chief Complaint  Patient presents with  . Shortness of Breath    HPI Elizabeth Schneider is a 53 y.o. female.     HPI 53 year old woman with past medical history of diabetes, hypertension presents to the emergency department for 2 weeks of ongoing fevers, cough, congestion in the setting of SARS coronavirus 2 which was detected Monday of this week 09/04/2018.  Patient was sent home without any specific medication therapy and told to self isolate.  Patient has continued to feel worse since initially being diagnosed and feels that she is more short of breath with continued fevers.  She does not have any pulmonary or cardiac disease that she is aware of. Past Medical History:  Diagnosis Date  . Anxiety   . Chronic back pain    "mid and lower" (05/27/2015)  . Chronic bronchitis ([place])   . Depression   . Fibromyalgia   . GERD (gastroesophageal reflux disease)   . Heart murmur    "clicking murmur"  . Hypertension   . Lupus ([place])   . Obesity   . Type II diabetes mellitus [facility])     Patient Active Problem List   Diagnosis Date Noted  . Pain in the chest   . SOB (shortness of breath)   . Diabetes mellitus type 2 in obese ([place]) 05/27/2015  . Fatigue 05/27/2015  . Generalized weakness 05/27/2015  . Chest pain 05/26/2015  . Syncope 05/26/2015    Past Surgical History:  Procedure Laterality Date  . APPENDECTOMY  ~ 1988  . [place]; 1987; 1991  . ENDOMETRIAL BIOPSY  2016  . LAPAROSCOPIC CHOLECYSTECTOMY  2000  . TUBAL LIGATION  1991     OB History   No obstetric history on file.      Home Medications    Prior to Admission medications   Medication Sig Start Date End Date Taking? Authorizing Provider  acyclovir (ZOVIRAX) 400 MG tablet Take 400 mg by mouth 2 (two) times daily as needed (for 5 days).  03/27/15    [provider]  Cholecalciferol (VITAMIN D3) 2000 units capsule Take 2,000 Units by mouth daily.    [provider]  ferrous sulfate 325 (65 FE) MG tablet Take 325 mg by mouth daily with breakfast.    [provider]  lisinopril (PRINIVIL,ZESTRIL) 20 MG tablet Take 20 mg by mouth daily. 05/22/15   [provider]  Lysine 1000 MG TABS Take 1,000 mg by mouth daily.    [provider]  MAGNESIUM PO Take 1 tablet by mouth daily.    [provider]  Melatonin 10 MG TABS Take 10 mg by mouth at bedtime.    [provider]  metFORMIN (GLUCOPHAGE-XR) 500 MG 24 hr tablet Take 2,000 mg by mouth every evening. 05/21/15   [provider]  omeprazole (PRILOSEC) 20 MG capsule Take 20 mg by mouth daily. 06/30/13   [provider]  phenazopyridine (PYRIDIUM) 200 MG tablet Take 200 mg by mouth daily.  05/16/15   [provider]  VITAMIN E PO Take 1 tablet by mouth daily.    [provider]  zolpidem (AMBIEN) 5 MG tablet Take 5 mg by mouth at bedtime. 05/14/15   [provider]    Family History No family history on file.  Social History Social History   Tobacco  Use  . Smoking status: Never Smoker  . Smokeless tobacco: Never Used  Substance Use Topics  . Alcohol use: Yes    Comment: 05/27/2015 "1-2 glasses of wine or beer once/month or so"  . Drug use: No     Allergies   Hydrocodone; Other; Pravastatin; and Sulfa antibiotics   Review of Systems Review of Systems  Constitutional: Positive for activity change, appetite change, chills, fatigue and fever.  HENT: Negative for ear pain and sore throat.   Eyes: Negative for pain and visual disturbance.  Respiratory: Positive for cough and shortness of breath.   Cardiovascular: Positive for chest pain. Negative for palpitations.  Gastrointestinal: Negative for abdominal pain, nausea and vomiting.  Endocrine: Negative for polyphagia and polyuria.   Genitourinary: Negative for dysuria and hematuria.  Musculoskeletal: Negative for arthralgias and back pain.  Skin: Negative for color change and rash.  Neurological: Negative for seizures and syncope.  All other systems reviewed and are negative.    Physical Exam Updated Vital Signs BP 126/80   Pulse 97   Temp 99.5 F (37.5 C) (Oral)   Resp (!) 24   Ht 5\' 2"  (1.575 m)   Wt 99.8 kg   SpO2 97%   BMI 40.24 kg/m   Physical Exam Vitals signs and nursing note reviewed.  Constitutional:      Appearance: She is well-developed. She is ill-appearing and diaphoretic.  HENT:     Head: Normocephalic and atraumatic.     Mouth/Throat:     Pharynx: No pharyngeal swelling or oropharyngeal exudate.  Eyes:     Conjunctiva/sclera: Conjunctivae normal.  Neck:     Musculoskeletal: Neck supple.  Cardiovascular:     Rate and Rhythm: Regular rhythm. Tachycardia present.     Heart sounds: No murmur.  Pulmonary:     Effort: Tachypnea present. No respiratory distress.     Breath sounds: Examination of the right-upper field reveals decreased breath sounds. Examination of the left-upper field reveals decreased breath sounds. Examination of the right-middle field reveals decreased breath sounds. Examination of the left-middle field reveals decreased breath sounds. Examination of the right-lower field reveals decreased breath sounds. Examination of the left-lower field reveals decreased breath sounds. Decreased breath sounds present.  Chest:     Chest wall: No edema.  Abdominal:     Palpations: Abdomen is soft.     Tenderness: There is no abdominal tenderness.  Musculoskeletal:     Right lower leg: She exhibits no tenderness. No edema.     Left lower leg: She exhibits no tenderness. No edema.  Skin:    General: Skin is warm.  Neurological:     General: No focal deficit present.     Mental Status: She is alert.      ED Treatments / Results  Labs (all labs ordered are listed, but only  abnormal results are displayed) Labs Reviewed  CBC WITH DIFFERENTIAL/PLATELET  BASIC METABOLIC PANEL  TROPONIN I    EKG None  Radiology No results found.  Procedures Procedures (including critical care time)  Medications Ordered in ED Medications - No data to display   Initial Impression / Assessment and Plan / ED Course  I have reviewed the triage vital signs and the nursing notes.  Pertinent labs & imaging results that were available during my care of the patient were reviewed by me and considered in my medical decision making (see chart for details).         Patient presents to the emergency department with  tachycardia, tachypnea in the setting of COVID 19+ test 5 days ago.  Continues to have fevers and feeling unwell.  No history of cardiac disease.  EKG with right axis deviation but no ischemic signs or symptoms.  Initial troponin negative.  Repeat troponin pending.  Chest x-ray with left sided infiltrate concerning for pneumonia.  Treated empirically with community-acquired coverage with ceftriaxone and azithromycin.  Given metronidazole for urinalysis findings of budding yeast..  Unlikely to be ACS given her continued URI symptoms including sore throat, cough, nasal congestion.  Ambulated in the room and became tachycardic to 125 bpm and became very dyspneic having to stop after only approximately walking 15 feet.  Did not become hypoxic and maintain saturation on room air greater than 98% the whole time.  VBG unremarkable.  Because of her ongoing fevers for the approximately 2 weeks other sources of infection were investigated such as urine.  She does not have any abdominal pain, nausea vomiting, diarrhea to suggest intra-abdominal pathology.  Due to her deconditioning and multiple etiologies of infection the patient was admitted for further management.  Will be assessed and likely sent to [place] isolation area. Final Clinical Impressions(s) / ED Diagnoses   Final diagnoses:   Community acquired pneumonia of left lower lobe of lung ([place])  COVID-19 virus infection  Dyspnea on exertion    ED Discharge Orders    None       Andee Poles, MD 09/09/18 2003    Drenda Freeze, MD 09/13/18 [phone]

## 2018-09-09 NOTE — ED Notes (Signed)
ED TO INPATIENT HANDOFF REPORT  ED Nurse Name and Phone #: Liz/Nicole [phone]  S Name/Age/Gender Elizabeth Schneider 53 y.o. female Room/Bed: 023C/023C  Code Status   Code Status: Prior  Home/SNF/Other Home Patient oriented to: self, place, time and situation Is this baseline? Yes   Triage Complete: Triage complete  Chief Complaint SOB; COVID positive  Triage Note Patient arrives POV stating she was called last Tuesday that she tested positive for covid. Patient states she has had fevers on and off. Patient states onset of last night having pain in her throat and upper chest "feels like I am breathing in cold air."    Allergies Allergies  Allergen Reactions  . Hydrocodone Itching  . Other Itching    [place]anch dressing ingredient  . Pravastatin Other (See Comments)    Muscle aches and pains  . Sulfa Antibiotics Other (See Comments)    Heaviness in arms    Level of Care/Admitting Diagnosis ED Disposition    ED Disposition Condition [facility] Area: [place] [100100]  Level of Care: Telemetry Medical [104]  I expect the patient will be discharged within 24 hours: No (not a candidate for 5C-Observation unit)  Covid Evaluation: Confirmed COVID Positive  Isolation Risk Level: Low Risk/Droplet (Less than 4L [place] supplementation)  Diagnosis: 2019 novel coronavirus disease (COVID-19) [[id number]]  Admitting Physician: Caren Griffins [id number]  Attending Physician: Caren Griffins [[id number]]  PT Class (Do Not Modify): Observation [104]  PT Acc Code (Do Not Modify): Observation [10022]       B Medical/Surgery History Past Medical History:  Diagnosis Date  . Anxiety   . Chronic back pain    "mid and lower" (05/27/2015)  . Chronic bronchitis ([place])   . Depression   . Fibromyalgia   . GERD (gastroesophageal reflux disease)   . Heart murmur    "clicking murmur"  . Hypertension   . Lupus ([place])   . Obesity   . Type II diabetes mellitus ([place])     Past Surgical History:  Procedure Laterality Date  . APPENDECTOMY  ~ 1988  . [place]; 1987; 1991  . ENDOMETRIAL BIOPSY  2016  . LAPAROSCOPIC CHOLECYSTECTOMY  2000  . TUBAL LIGATION  1991     A IV Location/Drains/Wounds Patient Lines/Drains/Airways Status   Active Line/Drains/Airways    Name:   Placement date:   Placement time:   Site:   Days:   Peripheral IV 09/09/18 Left Hand   09/09/18    1558    Hand   less than 1   Peripheral IV 09/09/18 Left Antecubital   09/09/18    1750    Antecubital   less than 1          Intake/Output Last 24 hours  Intake/Output Summary (Last 24 hours) at 09/09/2018 1814 Last data filed at 09/09/2018 1807 Gross per 24 hour  Intake 100 ml  Output -  Net 100 ml    Labs/Imaging Results for orders placed or performed during the hospital encounter of 09/09/18 (from the past 48 hour(s))  CBC with Differential/Platelet     Status: Abnormal   Collection Time: 09/09/18  3:57 PM  Result Value Ref Range   WBC 3.4 (L) 4.0 - 10.5 K/uL   RBC 5.06 3.87 - 5.11 MIL/uL   Hemoglobin 13.8 12.0 - 15.0 g/dL   HCT 43.1 36.0 - 46.0 %   MCV 85.2 80.0 - 100.0 fL   MCH 27.3 26.0 -  34.0 pg   MCHC 32.0 30.0 - 36.0 g/dL   RDW 12.7 11.5 - 15.5 %   Platelets 157 150 - 400 K/uL   nRBC 0.0 0.0 - 0.2 %   Neutrophils Relative % 43 %   Neutro Abs 1.5 (L) 1.7 - 7.7 K/uL   Lymphocytes Relative 44 %   Lymphs Abs 1.5 0.7 - 4.0 K/uL   Monocytes Relative 11 %   Monocytes Absolute 0.4 0.1 - 1.0 K/uL   Eosinophils Relative 1 %   Eosinophils Absolute 0.0 0.0 - 0.5 K/uL   Basophils Relative 0 %   Basophils Absolute 0.0 0.0 - 0.1 K/uL   Immature Granulocytes 1 %   Abs Immature Granulocytes 0.02 0.00 - 0.07 K/uL    Comment: Performed at [facility] [address] [place], [place] [address]  Basic metabolic panel     Status: Abnormal   Collection Time: 09/09/18  3:57 PM  Result Value Ref Range   Sodium 137 135 - 145 mmol/L   Potassium 4.1 3.5 - 5.1  mmol/L   Chloride 103 98 - 111 mmol/L   CO2 22 22 - 32 mmol/L   Glucose, Bld 108 (H) 70 - 99 mg/dL   BUN 9 6 - 20 mg/dL   Creatinine, Ser 0.96 0.44 - 1.00 mg/dL   Calcium 8.9 8.9 - 10.3 mg/dL   GFR calc non Af Amer >60 >60 mL/min   GFR calc Af Amer >60 >60 mL/min   Anion gap 12 5 - 15    Comment: Performed at [facility] [address], [place], [place] [address]  Troponin I - ONCE - STAT     Status: None   Collection Time: 09/09/18  3:57 PM  Result Value Ref Range   Troponin I <0.03 <0.03 ng/mL    Comment: Performed at [facility], [place] [address], [place], [place] [address]  Urinalysis, Routine w reflex microscopic     Status: Abnormal   Collection Time: 09/09/18  4:04 PM  Result Value Ref Range   Color, Urine YELLOW YELLOW   APPearance HAZY (A) CLEAR   Specific Gravity, Urine 1.006 1.005 - 1.030   pH 6.0 5.0 - 8.0   Glucose, UA NEGATIVE NEGATIVE mg/dL   Hgb urine dipstick SMALL (A) NEGATIVE   Bilirubin Urine NEGATIVE NEGATIVE   Ketones, ur NEGATIVE NEGATIVE mg/dL   Protein, ur NEGATIVE NEGATIVE mg/dL   Nitrite NEGATIVE NEGATIVE   Leukocytes,Ua MODERATE (A) NEGATIVE   RBC / HPF 0-5 0 - 5 RBC/hpf   WBC, UA 6-10 0 - 5 WBC/hpf   Bacteria, UA RARE (A) NONE SEEN   Squamous Epithelial / LPF 11-20 0 - 5   Budding Yeast PRESENT    Hyaline Casts, UA PRESENT     Comment: Performed at [facility], [place] [address] [place], [place] [address]  POCT I-Stat EG7     Status: Abnormal   Collection Time: 09/09/18  4:55 PM  Result Value Ref Range   pH, Ven 7.436 (H) 7.250 - 7.430   pCO2, Ven 39.0 (L) 44.0 - 60.0 mmHg   pO2, Ven 56.0 (H) 32.0 - 45.0 mmHg   Bicarbonate 26.3 20.0 - 28.0 mmol/L   TCO2 27 22 - 32 mmol/L   O2 Saturation 90.0 %   Acid-Base Excess 2.0 0.0 - 2.0 mmol/L   Sodium 138 135 - 145 mmol/L   Potassium 6.1 (H) 3.5 - 5.1 mmol/L   Calcium, Ion 1.01 (  L) 1.15 - 1.40 mmol/L   HCT 37.0 36.0 - 46.0 %   Hemoglobin 12.6 12.0 - 15.0 g/dL   Patient  temperature HIDE    Sample type VENOUS    Dg Chest Port 1 View  Result Date: 09/09/2018 CLINICAL DATA:  Shortness of breath. Chest and throat pain. Intermittent fevers. Reportedly positive test for COVID-19. EXAM: PORTABLE CHEST 1 VIEW COMPARISON:  Chest radiographs 05/26/2015 and CTA 05/26/2015 FINDINGS: The cardiomediastinal silhouette is within normal limits. Lung volumes are lower than on the prior radiographs with mild opacity noted in the retrocardiac left lower lobe. No pleural effusion or pneumothorax is identified. No acute osseous abnormality is seen. IMPRESSION: Low lung volumes with mild left lower lobe opacity which may reflect infection or atelectasis. Electronically Signed   By: Logan Bores M.D.   On: 09/09/2018 17:07    Pending Labs Unresulted Labs (From admission, onward)    Start     Ordered   09/09/18 1731  Blood gas, arterial  ONCE - STAT,   R     09/09/18 1730   09/09/18 1719  Lactic acid, plasma  Now then every 2 hours,   STAT     09/09/18 1719   09/09/18 1711  Blood culture (routine x 2)  BLOOD CULTURE X 2,   STAT     09/09/18 1715   09/09/18 1604  Urine culture  ONCE - STAT,   STAT     09/09/18 1604   Signed and Held  HIV antibody (Routine Testing)  Once,   R     Signed and Held   Visual merchandiser and Held  ABO/Rh  Once,   R     Signed and Held   Signed and Held  C-reactive protein  Once,   R     Signed and Held   Visual merchandiser and Held  D-dimer, quantitative (not at [facility])  Once,   R     Signed and Held   Signed and Held  Glucose 6 phosphate dehydrogenase  Once,   R     Signed and Held   Visual merchandiser and Held  Hepatitis B surface antigen  Once,   R     Signed and Held   Signed and Held  Procalcitonin  Once,   R     Signed and Held   Signed and Held  Lactate dehydrogenase  Once,   R     Signed and Held   Signed and Held  Sedimentation rate  Once,   R     Signed and Held   Signed and Held  CBC with Differential/Platelet  Daily,   R     Signed and Held   Visual merchandiser and Held   Comprehensive metabolic panel  Daily,   R     Signed and Held   Signed and Held  C-reactive protein  Daily,   R     Signed and Held   Signed and Held  D-dimer, quantitative (not at [facility])  Daily,   R     Signed and Held   Signed and Held  Ferritin  Daily,   R     Signed and Held   Signed and Held  Ferritin  Once,   R     Signed and Held   Signed and Held  Magnesium  Daily,   R     Signed and Held   Signed and Held  Phosphorus  Daily,   R     Signed and  Held          Vitals/Pain Today's Vitals   09/09/18 1536 09/09/18 1537 09/09/18 1700 09/09/18 1730  BP: 126/80  108/66 106/70  Pulse:   81 80  Resp:   (!) 21 17  Temp: 99.5 F (37.5 C)     TempSrc: Oral     SpO2:   96% 100%  Weight:  99.8 kg    Height:  5\' 2"  (1.575 m)    PainSc: 3        Isolation Precautions No active isolations  Medications Medications  azithromycin (ZITHROMAX) 500 mg in sodium chloride 0.9 % 250 mL IVPB (500 mg Intravenous New Bag/Given 09/09/18 1753)  lactated ringers bolus 1,000 mL (1,000 mLs Intravenous New Bag/Given 09/09/18 1625)  cefTRIAXone (ROCEPHIN) 1 g in sodium chloride 0.9 % 100 mL IVPB (0 g Intravenous Stopped 09/09/18 1807)  fluconazole (DIFLUCAN) tablet 150 mg (150 mg Oral Given 09/09/18 1728)    Mobility walks Low fall risk   Focused Assessments Pulmonary Assessment Handoff:  Lung sounds: Bilateral Breath Sounds: Diminished L Breath Sounds: Diminished R Breath Sounds: Diminished O2 Device: Room Air        R Recommendations: See Admitting Provider Note  Report given to:   Additional Notes: Positive COVID

## 2018-09-10 DIAGNOSIS — J181 Lobar pneumonia, unspecified organism: Secondary | ICD-10-CM

## 2018-09-10 LAB — COMPREHENSIVE METABOLIC PANEL
ALT: 20 U/L (ref 0–44)
ALT: 23 U/L (ref 0–44)
AST: 28 U/L (ref 15–41)
AST: 33 U/L (ref 15–41)
Albumin: 3.1 g/dL — ABNORMAL LOW (ref 3.5–5.0)
Albumin: 3.2 g/dL — ABNORMAL LOW (ref 3.5–5.0)
Alkaline Phosphatase: 56 U/L (ref 38–126)
Alkaline Phosphatase: 56 U/L (ref 38–126)
Anion gap: 9 (ref 5–15)
Anion gap: 8 (ref 5–15)
BUN: 9 mg/dL (ref 6–20)
BUN: 11 mg/dL (ref 6–20)
CO2: 23 mmol/L (ref 22–32)
CO2: 25 mmol/L (ref 22–32)
Calcium: 8.3 mg/dL — ABNORMAL LOW (ref 8.9–10.3)
Calcium: 8.3 mg/dL — ABNORMAL LOW (ref 8.9–10.3)
Chloride: 107 mmol/L (ref 98–111)
Chloride: 106 mmol/L (ref 98–111)
Creatinine, Ser: 0.79 mg/dL (ref 0.44–1.00)
Creatinine, Ser: 0.84 mg/dL (ref 0.44–1.00)
GFR calc Af Amer: >60 mL/min (ref >60)
GFR calc Af Amer: >60 mL/min (ref >60)
GFR calc non Af Amer: >60 mL/min (ref >60)
GFR calc non Af Amer: >60 mL/min (ref >60)
Glucose, Bld: 137 mg/dL — ABNORMAL HIGH (ref 70–99)
Glucose, Bld: 117 mg/dL — ABNORMAL HIGH (ref 70–99)
Potassium: 3.7 mmol/L (ref 3.5–5.1)
Potassium: 3.9 mmol/L (ref 3.5–5.1)
Sodium: 139 mmol/L (ref 135–145)
Sodium: 139 mmol/L (ref 135–145)
Total Bilirubin: 0.5 mg/dL (ref 0.3–1.2)
Total Bilirubin: 0.5 mg/dL (ref 0.3–1.2)
Total Protein: 6.4 g/dL — ABNORMAL LOW (ref 6.5–8.1)
Total Protein: 6.8 g/dL (ref 6.5–8.1)

## 2018-09-10 LAB — CBC WITH DIFFERENTIAL/PLATELET
Abs Immature Granulocytes: 0.01 10*3/uL (ref 0.00–0.07)
Abs Immature Granulocytes: 0.01 10*3/uL (ref 0.00–0.07)
Basophils Absolute: 0 10*3/uL (ref 0.0–0.1)
Basophils Absolute: 0 10*3/uL (ref 0.0–0.1)
Basophils Relative: 0 %
Basophils Relative: 0 %
Eosinophils Absolute: 0 10*3/uL (ref 0.0–0.5)
Eosinophils Absolute: 0 10*3/uL (ref 0.0–0.5)
Eosinophils Relative: 0 %
Eosinophils Relative: 1 %
HCT: 36.3 % (ref 36.0–46.0)
HCT: 37.6 % (ref 36.0–46.0)
Hemoglobin: 11.5 g/dL — ABNORMAL LOW (ref 12.0–15.0)
Hemoglobin: 11.9 g/dL — ABNORMAL LOW (ref 12.0–15.0)
Immature Granulocytes: 0 %
Immature Granulocytes: 0 %
Lymphocytes Relative: 52 %
Lymphocytes Relative: 48 %
Lymphs Abs: 1.6 10*3/uL (ref 0.7–4.0)
Lymphs Abs: 1.6 10*3/uL (ref 0.7–4.0)
MCH: 27.2 pg (ref 26.0–34.0)
MCH: 27.4 pg (ref 26.0–34.0)
MCHC: 31.7 g/dL (ref 30.0–36.0)
MCHC: 31.6 g/dL (ref 30.0–36.0)
MCV: 85.8 fL (ref 80.0–100.0)
MCV: 86.6 fL (ref 80.0–100.0)
Monocytes Absolute: 0.3 10*3/uL (ref 0.1–1.0)
Monocytes Absolute: 0.3 10*3/uL (ref 0.1–1.0)
Monocytes Relative: 9 %
Monocytes Relative: 10 %
Neutro Abs: 1.2 10*3/uL — ABNORMAL LOW (ref 1.7–7.7)
Neutro Abs: 1.3 10*3/uL — ABNORMAL LOW (ref 1.7–7.7)
Neutrophils Relative %: 39 %
Neutrophils Relative %: 41 %
Platelets: 158 10*3/uL (ref 150–400)
Platelets: 148 10*3/uL — ABNORMAL LOW (ref 150–400)
RBC: 4.23 MIL/uL (ref 3.87–5.11)
RBC: 4.34 MIL/uL (ref 3.87–5.11)
RDW: 13 % (ref 11.5–15.5)
RDW: 13 % (ref 11.5–15.5)
WBC: 3 10*3/uL — ABNORMAL LOW (ref 4.0–10.5)
WBC: 3.3 10*3/uL — ABNORMAL LOW (ref 4.0–10.5)
nRBC: 0 % (ref 0.0–0.2)
nRBC: 0 % (ref 0.0–0.2)

## 2018-09-10 LAB — FERRITIN
Ferritin: 50 ng/mL (ref 11–307)
Ferritin: 58 ng/mL (ref 11–307)

## 2018-09-10 LAB — URINE CULTURE

## 2018-09-10 LAB — HIV ANTIBODY (ROUTINE TESTING W REFLEX): HIV Screen 4th Generation wRfx: NONREACTIVE

## 2018-09-10 LAB — C-REACTIVE PROTEIN
CRP: 1.8 mg/dL — ABNORMAL HIGH (ref <1.0)
CRP: 1.9 mg/dL — ABNORMAL HIGH (ref <1.0)

## 2018-09-10 LAB — LACTATE DEHYDROGENASE: LDH: 149 U/L (ref 98–192)

## 2018-09-10 LAB — PHOSPHORUS
Phosphorus: 2.6 mg/dL (ref 2.5–4.6)
Phosphorus: 2.8 mg/dL (ref 2.5–4.6)

## 2018-09-10 LAB — GLUCOSE, CAPILLARY
Glucose-Capillary: 137 mg/dL — ABNORMAL HIGH (ref 70–99)
Glucose-Capillary: 101 mg/dL — ABNORMAL HIGH (ref 70–99)

## 2018-09-10 LAB — MAGNESIUM
Magnesium: 1.4 mg/dL — ABNORMAL LOW (ref 1.7–2.4)
Magnesium: 1.5 mg/dL — ABNORMAL LOW (ref 1.7–2.4)

## 2018-09-10 LAB — SEDIMENTATION RATE: Sed Rate: 58 mm/hr — ABNORMAL HIGH (ref 0–22)

## 2018-09-10 LAB — D-DIMER, QUANTITATIVE
D-Dimer, Quant: 0.89 ug/mL-FEU — ABNORMAL HIGH (ref 0.00–0.50)
D-Dimer, Quant: 1.5 ug/mL-FEU — ABNORMAL HIGH (ref 0.00–0.50)

## 2018-09-10 LAB — ABO/RH: ABO/RH(D): O POS

## 2018-09-10 LAB — PROCALCITONIN: Procalcitonin: <0.1 ng/mL

## 2018-09-10 MED ORDER — ALBUTEROL SULFATE HFA 108 (90 BASE) MCG/ACT IN AERS
2.0000 | INHALATION_SPRAY | Freq: Four times a day (QID) | RESPIRATORY_TRACT | Status: DC | PRN
  Filled 2018-09-10: qty 6.7

## 2018-09-10 MED ORDER — ENOXAPARIN SODIUM 40 MG/0.4ML ~~LOC~~ SOLN
40.0000 mg | SUBCUTANEOUS | Status: DC
  Administered 2018-09-10 – 2018-09-11 (×2): 40 mg via SUBCUTANEOUS
  Filled 2018-09-10 (×2): qty 0.4

## 2018-09-10 MED ORDER — MENTHOL 3 MG MT LOZG
1.0000 | LOZENGE | OROMUCOSAL | Status: DC | PRN
  Filled 2018-09-10: qty 9

## 2018-09-10 NOTE — Progress Notes (Signed)
PROGRESS NOTE        PATIENT DETAILS Name: Elizabeth Schneider Age: 53 y.o. Sex: female Date of Birth: 03-Sep-1965 Admit Date: 09/09/2018 Admitting Physician Elwyn Reach, MD HFW:YOVZCH, Leroy Sea, MD (Inactive)  Brief Narrative: Patient is a 53 y.o. female we have DM-2, HTN, obesity who recently was diagnosed with COVID-19 at a another facility on 4/27-while at home-she continued to feel poorly, and had worsening cough and shortness of breath.  She was subsequently admitted to [facility] on 5/2.  See below for further details   Subjective: Her voice is hoarse-but feels somewhat better than yesterday.  On room air this morning.  Assessment/Plan: COVID-19 infection with probable superimposed bacterial pneumonia: Slowly improving-has a significant amount of sore throat this morning.  Continue empiric Rocephin/Zithromax-continue close monitoring-if improvement continues-suspect home in the next few days.  Ambulate with physical therapy.  Awaiting labs this morning.  HTN: Blood pressure controlled-currently not on any antihypertensives.  DM-2: CBGs stable with SSI.  DVT Prophylaxis: Prophylactic Lovenox   Code Status: Full code   Family Communication: None at bedside-spoke with spouse over the phone  Disposition Plan: Remain inpatient-home in the next day or so if clinical improvement continues.  Antimicrobial agents: Anti-infectives (From admission, onward)   Start     Dose/Rate Route Frequency Ordered Stop   09/10/18 1000  azithromycin (ZITHROMAX) 500 mg in sodium chloride 0.9 % 250 mL IVPB     500 mg 250 mL/hr over 60 Minutes Intravenous Every 24 hours 09/09/18 2221     09/10/18 0800  cefTRIAXone (ROCEPHIN) 1 g in sodium chloride 0.9 % 100 mL IVPB  Status:  Discontinued     1 g 200 mL/hr over 30 Minutes Intravenous Every 24 hours 09/09/18 2221 09/09/18 2247   09/10/18 0800  cefTRIAXone (ROCEPHIN) 2 g in sodium chloride 0.9 % 100 mL IVPB     2 g  200 mL/hr over 30 Minutes Intravenous Every 24 hours 09/09/18 2246     09/09/18 1730  azithromycin (ZITHROMAX) 500 mg in sodium chloride 0.9 % 250 mL IVPB     500 mg 250 mL/hr over 60 Minutes Intravenous  Once 09/09/18 1715 09/09/18 2220   09/09/18 1715  cefTRIAXone (ROCEPHIN) 1 g in sodium chloride 0.9 % 100 mL IVPB     1 g 200 mL/hr over 30 Minutes Intravenous  Once 09/09/18 1700 09/09/18 1807   09/09/18 1715  fluconazole (DIFLUCAN) tablet 150 mg     150 mg Oral  Once 09/09/18 1700 09/09/18 1728      Procedures: None  CONSULTS:  None  Time spent: 25- minutes-Greater than 50% of this time was spent in counseling, explanation of diagnosis, planning of further management, and coordination of care.  MEDICATIONS: Scheduled Meds: . buPROPion  450 mg Oral QHS  . enoxaparin (LOVENOX) injection  40 mg Subcutaneous Q24H  . insulin aspart  0-9 Units Subcutaneous TID WC  . traZODone  100 mg Oral QHS  . vitamin C  500 mg Oral Daily  . zinc sulfate  220 mg Oral Daily   Continuous Infusions: . azithromycin 500 mg (09/10/18 1046)  . cefTRIAXone (ROCEPHIN)  IV 2 g (09/10/18 0845)   PRN Meds:.acetaminophen, albuterol   PHYSICAL EXAM: Vital signs: Vitals:   09/09/18 2300 09/10/18 0000 09/10/18 0442 09/10/18 0818  BP:  96/81 (!) 93/57 101/65  Pulse:  75 68 74  Resp:  17 13 20   Temp: 98.9 F (37.2 C) 98.2 F (36.8 C) 98.1 F (36.7 C) 98.4 F (36.9 C)  TempSrc: Oral Oral Oral Oral  SpO2:  95% 96%   Weight:      Height:       Filed Weights   09/09/18 1537 09/09/18 2230  Weight: 99.8 kg 95.3 kg   Body mass index is 38.41 kg/m.   General appearance :Awake, alert, not in any distress. HEENT: Atraumatic and Normocephalic Neck: supple Resp:Good air entry bilaterally, no added sounds  CVS: S1 S2 regular, no murmurs.  GI: Bowel sounds present, Non tender and not distended with no gaurding, rigidity or rebound.No organomegaly Extremities: B/L Lower Ext shows no edema, both legs  are warm to touch Neurology:  speech clear,Non focal, sensation is grossly intact. Musculoskeletal:No digital cyanosis Skin:No Rash, warm and dry Wounds:N/A  I have personally reviewed following labs and imaging studies  LABORATORY DATA: CBC: Recent Labs  Lab 09/09/18 1557 09/09/18 1655 09/09/18 2359  WBC 3.4*  --  3.0*  NEUTROABS 1.5*  --  1.2*  HGB 13.8 12.6 11.5*  HCT 43.1 37.0 36.3  MCV 85.2  --  85.8  PLT 157  --  226    Basic Metabolic Panel: Recent Labs  Lab 09/09/18 1557 09/09/18 1655 09/09/18 2359  NA 137 138 139  K 4.1 6.1* 3.7  CL 103  --  107  CO2 22  --  23  GLUCOSE 108*  --  137*  BUN 9  --  9  CREATININE 0.96  --  0.79  CALCIUM 8.9  --  8.3*  MG  --   --  1.4*  PHOS  --   --  2.6    GFR: Estimated Creatinine Clearance: 86.6 mL/min (by C-G formula based on SCr of 0.79 mg/dL).  Liver Function Tests: Recent Labs  Lab 09/09/18 2359  AST 28  ALT 20  ALKPHOS 56  BILITOT 0.5  PROT 6.4*  ALBUMIN 3.1*   No results for input(s): LIPASE, AMYLASE in the last 168 hours. No results for input(s): AMMONIA in the last 168 hours.  Coagulation Profile: No results for input(s): INR, PROTIME in the last 168 hours.  Cardiac Enzymes: Recent Labs  Lab 09/09/18 1557  TROPONINI <0.03    BNP (last 3 results) No results for input(s): PROBNP in the last 8760 hours.  HbA1C: No results for input(s): HGBA1C in the last 72 hours.  CBG: Recent Labs  Lab 09/09/18 2226 09/10/18 0837  GLUCAP 90 137*    Lipid Profile: No results for input(s): CHOL, HDL, LDLCALC, TRIG, CHOLHDL, LDLDIRECT in the last 72 hours.  Thyroid Function Tests: No results for input(s): TSH, T4TOTAL, FREET4, T3FREE, THYROIDAB in the last 72 hours.  Anemia Panel: Recent Labs    09/09/18 2359  FERRITIN 50    Urine analysis:    Component Value Date/Time   COLORURINE YELLOW 09/09/2018 1604   APPEARANCEUR HAZY (A) 09/09/2018 1604   LABSPEC 1.006 09/09/2018 1604   PHURINE  6.0 09/09/2018 1604   GLUCOSEU NEGATIVE 09/09/2018 1604   HGBUR SMALL (A) 09/09/2018 1604   BILIRUBINUR NEGATIVE 09/09/2018 1604   KETONESUR NEGATIVE 09/09/2018 1604   PROTEINUR NEGATIVE 09/09/2018 1604   NITRITE NEGATIVE 09/09/2018 1604   LEUKOCYTESUR MODERATE (A) 09/09/2018 1604    Sepsis Labs: Lactic Acid, Venous    Component Value Date/Time   LATICACIDVEN 0.8 09/09/2018 2013    MICROBIOLOGY: Recent Results (from the  past 240 hour(s))  Urine culture     Status: None   Collection Time: 09/09/18  4:08 PM  Result Value Ref Range Status   Specimen Description URINE, CLEAN CATCH  Final   Special Requests   Final    NONE Performed at [facility], [address], [place], [place] [address]    Culture   Final    Multiple bacterial morphotypes present, none predominant. Suggest appropriate recollection if clinically indicated.   Report Status 09/10/2018 FINAL  Final  Blood culture (routine x 2)     Status: None (Preliminary result)   Collection Time: 09/09/18  6:26 PM  Result Value Ref Range Status   Specimen Description BLOOD LEFT ANTECUBITAL  Final   Special Requests   Final    BOTTLES DRAWN AEROBIC AND ANAEROBIC Blood Culture adequate volume   Culture   Final    NO GROWTH < 24 HOURS Performed at [facility], [place] [address], [place], [place] [address]    Report Status PENDING  Incomplete  Blood culture (routine x 2)     Status: None (Preliminary result)   Collection Time: 09/09/18  8:13 PM  Result Value Ref Range Status   Specimen Description BLOOD RIGHT UPPER ARM  Final   Special Requests   Final    BOTTLES DRAWN AEROBIC AND ANAEROBIC Blood Culture adequate volume   Culture   Final    NO GROWTH < 12 HOURS Performed at [facility], [place] [address] [place], [place] [address]    Report Status PENDING  Incomplete    RADIOLOGY STUDIES/RESULTS: Dg Chest Port 1 View  Result Date: 09/09/2018 CLINICAL DATA:  Shortness of breath. Chest and throat pain.  Intermittent fevers. Reportedly positive test for COVID-19. EXAM: PORTABLE CHEST 1 VIEW COMPARISON:  Chest radiographs 05/26/2015 and CTA 05/26/2015 FINDINGS: The cardiomediastinal silhouette is within normal limits. Lung volumes are lower than on the prior radiographs with mild opacity noted in the retrocardiac left lower lobe. No pleural effusion or pneumothorax is identified. No acute osseous abnormality is seen. IMPRESSION: Low lung volumes with mild left lower lobe opacity which may reflect infection or atelectasis. Electronically Signed   By: Logan Bores M.D.   On: 09/09/2018 17:07     LOS: 1 day   Oren Binet, MD  [facility]  If 7PM-7AM, please contact night-coverage  Please page via [email or web address]  Go to [email or web address] and use [facility] universal password to access. If you do not have the password, please contact the hospital operator.  Locate the [facility] provider you are looking for under [facility] and page to a number that you can be directly reached. If you still have difficulty reaching the provider, please page the [facility] (Director on Call) for the Hospitalists listed on amion for assistance.  09/10/2018, 11:46 AM

## 2018-09-10 NOTE — Progress Notes (Signed)
Resting in bed this am.  On room air.  O2 sat 95%.  C/o tightness in chest with deep breaths.

## 2018-09-10 NOTE — Care Management (Signed)
Case manager will continue to monitor for disposition as patient medically improves. May God bless her to do so.    Ricki Miller, RN BSN Case Manager [phone]

## 2018-09-10 NOTE — Plan of Care (Signed)
  Problem: Education: Goal: Knowledge of risk factors and measures for prevention of condition will improve Outcome: Progressing   Problem: Coping: Goal: Psychosocial and spiritual needs will be supported Outcome: Progressing   Problem: Respiratory: Goal: Will maintain a patent airway Outcome: Progressing Goal: Complications related to the disease process, condition or treatment will be avoided or minimized Outcome: Progressing   Problem: Education: Goal: Knowledge of General Education information will improve Description Including pain rating scale, medication(s)/side effects and non-pharmacologic comfort measures Outcome: Progressing   Problem: Health Behavior/Discharge Planning: Goal: Ability to manage health-related needs will improve Outcome: Progressing   Problem: Clinical Measurements: Goal: Ability to maintain clinical measurements within normal limits will improve Outcome: Progressing Goal: Will remain free from infection Outcome: Progressing Goal: Diagnostic test results will improve Outcome: Progressing Goal: Respiratory complications will improve Outcome: Progressing Goal: Cardiovascular complication will be avoided Outcome: Progressing   Problem: Nutrition: Goal: Adequate nutrition will be maintained Outcome: Progressing   Problem: Coping: Goal: Level of anxiety will decrease Outcome: Progressing   Problem: Elimination: Goal: Will not experience complications related to bowel motility Outcome: Progressing Goal: Will not experience complications related to urinary retention Outcome: Progressing   Problem: Pain Managment: Goal: General experience of comfort will improve Outcome: Progressing   Problem: Safety: Goal: Ability to remain free from injury will improve Outcome: Progressing   Problem: Skin Integrity: Goal: Risk for impaired skin integrity will decrease Outcome: Progressing

## 2018-09-11 DIAGNOSIS — E1169 Type 2 diabetes mellitus with other specified complication: Secondary | ICD-10-CM

## 2018-09-11 DIAGNOSIS — E669 Obesity, unspecified: Secondary | ICD-10-CM

## 2018-09-11 LAB — CBC WITH DIFFERENTIAL/PLATELET
Abs Immature Granulocytes: 0.01 10*3/uL (ref 0.00–0.07)
Basophils Absolute: 0 10*3/uL (ref 0.0–0.1)
Basophils Relative: 0 %
Eosinophils Absolute: 0.1 10*3/uL (ref 0.0–0.5)
Eosinophils Relative: 2 %
HCT: 38.2 % (ref 36.0–46.0)
Hemoglobin: 11.7 g/dL — ABNORMAL LOW (ref 12.0–15.0)
Immature Granulocytes: 0 %
Lymphocytes Relative: 61 %
Lymphs Abs: 1.8 10*3/uL (ref 0.7–4.0)
MCH: 26.8 pg (ref 26.0–34.0)
MCHC: 30.6 g/dL (ref 30.0–36.0)
MCV: 87.6 fL (ref 80.0–100.0)
Monocytes Absolute: 0.4 10*3/uL (ref 0.1–1.0)
Monocytes Relative: 12 %
Neutro Abs: 0.8 10*3/uL — ABNORMAL LOW (ref 1.7–7.7)
Neutrophils Relative %: 25 %
Platelets: 157 10*3/uL (ref 150–400)
RBC: 4.36 MIL/uL (ref 3.87–5.11)
RDW: 12.9 % (ref 11.5–15.5)
WBC: 3 10*3/uL — ABNORMAL LOW (ref 4.0–10.5)
nRBC: 0 % (ref 0.0–0.2)

## 2018-09-11 LAB — C-REACTIVE PROTEIN: CRP: 1.2 mg/dL — ABNORMAL HIGH (ref <1.0)

## 2018-09-11 LAB — COMPREHENSIVE METABOLIC PANEL
ALT: 21 U/L (ref 0–44)
AST: 29 U/L (ref 15–41)
Albumin: 3.2 g/dL — ABNORMAL LOW (ref 3.5–5.0)
Alkaline Phosphatase: 56 U/L (ref 38–126)
Anion gap: 5 (ref 5–15)
BUN: 12 mg/dL (ref 6–20)
CO2: 29 mmol/L (ref 22–32)
Calcium: 8.4 mg/dL — ABNORMAL LOW (ref 8.9–10.3)
Chloride: 106 mmol/L (ref 98–111)
Creatinine, Ser: 0.86 mg/dL (ref 0.44–1.00)
GFR calc Af Amer: >60 mL/min (ref >60)
GFR calc non Af Amer: >60 mL/min (ref >60)
Glucose, Bld: 97 mg/dL (ref 70–99)
Potassium: 3.9 mmol/L (ref 3.5–5.1)
Sodium: 140 mmol/L (ref 135–145)
Total Bilirubin: 0.4 mg/dL (ref 0.3–1.2)
Total Protein: 6.7 g/dL (ref 6.5–8.1)

## 2018-09-11 LAB — PHOSPHORUS: Phosphorus: 3.6 mg/dL (ref 2.5–4.6)

## 2018-09-11 LAB — HEPATITIS B SURFACE ANTIGEN: Hepatitis B Surface Ag: NEGATIVE

## 2018-09-11 LAB — PATHOLOGIST SMEAR REVIEW

## 2018-09-11 LAB — GLUCOSE, CAPILLARY
Glucose-Capillary: 122 mg/dL — ABNORMAL HIGH (ref 70–99)
Glucose-Capillary: 99 mg/dL (ref 70–99)

## 2018-09-11 LAB — FERRITIN: Ferritin: 51 ng/mL (ref 11–307)

## 2018-09-11 LAB — MAGNESIUM: Magnesium: 1.7 mg/dL (ref 1.7–2.4)

## 2018-09-11 LAB — D-DIMER, QUANTITATIVE: D-Dimer, Quant: 1.06 ug/mL-FEU — ABNORMAL HIGH (ref 0.00–0.50)

## 2018-09-11 MED ORDER — ONDANSETRON HCL 4 MG PO TABS: 4.0000 mg | ORAL_TABLET | Freq: Every day | ORAL | 0 refills | Status: AC | PRN

## 2018-09-11 MED ORDER — BENZONATATE 100 MG PO CAPS: 100.0000 mg | ORAL_CAPSULE | Freq: Four times a day (QID) | ORAL | 0 refills | Status: AC | PRN

## 2018-09-11 MED ORDER — ASCORBIC ACID 500 MG PO TABS: 500.0000 mg | ORAL_TABLET | Freq: Every day | ORAL | 0 refills | Status: AC

## 2018-09-11 MED ORDER — ZINC SULFATE 220 (50 ZN) MG PO CAPS: 220.0000 mg | ORAL_CAPSULE | Freq: Every day | ORAL | 0 refills | Status: AC

## 2018-09-11 MED ORDER — ONDANSETRON HCL 4 MG/2ML IJ SOLN
4.0000 mg | Freq: Four times a day (QID) | INTRAMUSCULAR | Status: DC | PRN
  Administered 2018-09-11: 4 mg via INTRAVENOUS
  Filled 2018-09-11: qty 2

## 2018-09-11 MED ORDER — AMOXICILLIN-POT CLAVULANATE 875-125 MG PO TABS: 1.0000 | ORAL_TABLET | Freq: Two times a day (BID) | ORAL | 0 refills | Status: AC

## 2018-09-11 NOTE — Discharge Summary (Addendum)
PATIENT DETAILS Name: Elizabeth Schneider Age: 53 y.o. Sex: female Date of Birth: 1965-08-26 MRN: [id number]. Admitting Physician: Elwyn Reach, MD [id number], Leroy Sea, MD (Inactive)  Admit Date: 09/09/2018 Discharge date: 09/11/2018  Recommendations for Outpatient Follow-up:  1. Follow up with PCP in 1-2 weeks 2. Please obtain BMP/CBC in one week   Admitted From:  Home  Disposition: [place]: No  Equipment/Devices: None  Discharge Condition: Stable  CODE STATUS: FULL CODE  Diet recommendation:  Heart Healthy / Carb Modified  Brief Summary: See H&P, Labs, Consult and Test reports for all details in brief, Patient is a 53 y.o. female we have DM-2, HTN, obesity who recently was diagnosed with COVID-19 at a another facility on 4/27-while at home-she continued to feel poorly, and had worsening cough and shortness of breath.  She was subsequently admitted to [facility] on 5/2.  See below for further details  Brief Hospital Course: COVID-19 infection with probable superimposed bacterial pneumonia: Improved with just supportive care-due to concern for superimposed bacterial pneumonia-started on Rocephin/Zithromax.  On room air-symptomatically much better-still has some cough but that is to be expected.  Lungs are clear on auscultation-we will transition to Augmentin for a few more days to complete a 5-day course.  Sore throat is much better than yesterday.  Stable for discharge.    HTN: Blood pressure controlled-currently not on any antihypertensives.  Follow with PCP for resumption of antihypertensives.  DM-2: CBGs stable with SSI-is diet controlled in the outpatient setting-follow with PCP.  Procedures/Studies: None  Discharge Diagnoses:  Active Problems:   2019 novel coronavirus disease (COVID-19)   COVID-19 virus detected   Discharge Instructions:  Activity:  As tolerated with Full fall precautions use walker/cane & assistance as needed    Discharge Instructions    Call MD for:  difficulty breathing, headache or visual disturbances   Complete by:  As directed    Call MD for:  temperature >100.4   Complete by:  As directed    Diet - low sodium heart healthy   Complete by:  As directed    Diet Carb Modified   Complete by:  As directed    Discharge instructions   Complete by:  As directed    Follow with Primary MD  Charletta Cousin, MD (Inactive)  and other consultant's as instructed your Hospitalist MD  Please get a complete blood count and chemistry panel checked by your Primary MD at your next visit, and again as instructed by your Primary MD.  Get Medicines reviewed and adjusted: Please take all your medications with you for your next visit with your Primary MD  Laboratory/radiological data: Please request your Primary MD to go over all hospital tests and procedure/radiological results at the follow up, please ask your Primary MD to get all Hospital records sent to his/her office.  In some cases, they will be blood work, cultures and biopsy results pending at the time of your discharge. Please request that your primary care M.D. follows up on these results.  Also Note the following: If you experience worsening of your admission symptoms, develop shortness of breath, life threatening emergency, suicidal or homicidal thoughts you must seek medical attention immediately by calling 911 or calling your MD immediately  if symptoms less severe.  You must read complete instructions/literature along with all the possible adverse reactions/side effects for all the Medicines you take and that have been prescribed to you. Take any new Medicines after you have completely  understood and accpet all the possible adverse reactions/side effects.   Do not drive when taking Pain medications or sleeping medications (Benzodaizepines)  Do not take more than prescribed Pain, Sleep and Anxiety Medications. It is not advisable to combine  anxiety,sleep and pain medications without talking with your primary care practitioner  Special Instructions: If you have smoked or chewed Tobacco  in the last 2 yrs please stop smoking, stop any regular Alcohol  and or any Recreational drug use.  Wear Seat belts while driving.  Please note: You were cared for by a hospitalist during your hospital stay. Once you are discharged, your primary care physician will handle any further medical issues. Please note that NO REFILLS for any discharge medications will be authorized once you are discharged, as it is imperative that you return to your primary care physician (or establish a relationship with a primary care physician if you do not have one) for your post hospital discharge needs so that they can reassess your need for medications and monitor your lab values.  ?   Person Under Monitoring Name: Elizabeth Schneider  Location: [address] [place] [address]   Infection Prevention Recommendations for Individuals Confirmed to have, or Being Evaluated for, 2019 Novel Coronavirus (COVID-19) Infection Who Receive Care at Home  Individuals who are confirmed to have, or are being evaluated for, COVID-19 should follow the prevention steps below until a healthcare provider or local or state health department says they can return to normal activities.  Stay home except to get medical care You should restrict activities outside your home, except for getting medical care. Do not go to work, school, or public areas, and do not use public transportation or taxis.  Call ahead before visiting your doctor Before your medical appointment, call the healthcare provider and tell them that you have, or are being evaluated for, COVID-19 infection. This will help the healthcare provider's office take steps to keep other people from getting infected. Ask your healthcare provider to call the local or state health department.  Monitor your symptoms Seek  prompt medical attention if your illness is worsening (e.g., difficulty breathing). Before going to your medical appointment, call the healthcare provider and tell them that you have, or are being evaluated for, COVID-19 infection. Ask your healthcare provider to call the local or state health department.  Wear a facemask You should wear a facemask that covers your nose and mouth when you are in the same room with other people and when you visit a healthcare provider. People who live with or visit you should also wear a facemask while they are in the same room with you.  Separate yourself from other people in your home As much as possible, you should stay in a different room from other people in your home. Also, you should use a separate bathroom, if available.  Avoid sharing household items You should not share dishes, drinking glasses, cups, eating utensils, towels, bedding, or other items with other people in your home. After using these items, you should wash them thoroughly with soap and water.  Cover your coughs and sneezes Cover your mouth and nose with a tissue when you cough or sneeze, or you can cough or sneeze into your sleeve. Throw used tissues in a lined trash can, and immediately wash your hands with soap and water for at least 20 seconds or use an alcohol-based hand rub.  Wash your [facility] your hands often and thoroughly with soap and water  for at least 20 seconds. You can use an alcohol-based hand sanitizer if soap and water are not available and if your hands are not visibly dirty. Avoid touching your eyes, nose, and mouth with unwashed hands.   Prevention Steps for Caregivers and Household Members of Individuals Confirmed to have, or Being Evaluated for, COVID-19 Infection Being Cared for in the Home  If you live with, or provide care at home for, a person confirmed to have, or being evaluated for, COVID-19 infection please follow these guidelines to prevent  infection:  Follow healthcare provider's instructions Make sure that you understand and can help the patient follow any healthcare provider instructions for all care.  Provide for the patient's basic needs You should help the patient with basic needs in the home and provide support for getting groceries, prescriptions, and other personal needs.  Monitor the patient's symptoms If they are getting sicker, call his or her medical provider and tell them that the patient has, or is being evaluated for, COVID-19 infection. This will help the healthcare provider's office take steps to keep other people from getting infected. Ask the healthcare provider to call the local or state health department.  Limit the number of people who have contact with the patient If possible, have only one caregiver for the patient. Other household members should stay in another home or place of residence. If this is not possible, they should stay in another room, or be separated from the patient as much as possible. Use a separate bathroom, if available. Restrict visitors who do not have an essential need to be in the home.  Keep older adults, very young children, and other sick people away from the patient Keep older adults, very young children, and those who have compromised immune systems or chronic health conditions away from the patient. This includes people with chronic heart, lung, or kidney conditions, diabetes, and cancer.  Ensure good ventilation Make sure that shared spaces in the home have good air flow, such as from an air conditioner or an opened window, weather permitting.  Wash your hands often Wash your hands often and thoroughly with soap and water for at least 20 seconds. You can use an alcohol based hand sanitizer if soap and water are not available and if your hands are not visibly dirty. Avoid touching your eyes, nose, and mouth with unwashed hands. Use disposable paper towels to dry your  hands. If not available, use dedicated cloth towels and replace them when they become wet.  Wear a facemask and gloves Wear a disposable facemask at all times in the room and gloves when you touch or have contact with the patient's blood, body fluids, and/or secretions or excretions, such as sweat, saliva, sputum, nasal mucus, vomit, urine, or feces.  Ensure the mask fits over your nose and mouth tightly, and do not touch it during use. Throw out disposable facemasks and gloves after using them. Do not reuse. Wash your hands immediately after removing your facemask and gloves. If your personal clothing becomes contaminated, carefully remove clothing and launder. Wash your hands after handling contaminated clothing. Place all used disposable facemasks, gloves, and other waste in a lined container before disposing them with other household waste. Remove gloves and wash your hands immediately after handling these items.  Do not share dishes, glasses, or other household items with the patient Avoid sharing household items. You should not share dishes, drinking glasses, cups, eating utensils, towels, bedding, or other items with a patient who  is confirmed to have, or being evaluated for, COVID-19 infection. After the person uses these items, you should wash them thoroughly with soap and water.  Wash laundry thoroughly Immediately remove and wash clothes or bedding that have blood, body fluids, and/or secretions or excretions, such as sweat, saliva, sputum, nasal mucus, vomit, urine, or feces, on them. Wear gloves when handling laundry from the patient. Read and follow directions on labels of laundry or clothing items and detergent. In general, wash and dry with the warmest temperatures recommended on the label.  Clean all areas the individual has used often Clean all touchable surfaces, such as counters, tabletops, doorknobs, bathroom fixtures, toilets, phones, keyboards, tablets, and bedside tables,  every day. Also, clean any surfaces that may have blood, body fluids, and/or secretions or excretions on them. Wear gloves when cleaning surfaces the patient has come in contact with. Use a diluted bleach solution (e.g., dilute bleach with 1 part bleach and 10 parts water) or a household disinfectant with a label that says EPA-registered for coronaviruses. To make a bleach solution at home, add 1 tablespoon of bleach to 1 quart (4 cups) of water. For a larger supply, add  cup of bleach to 1 gallon (16 cups) of water. Read labels of cleaning products and follow recommendations provided on product labels. Labels contain instructions for safe and effective use of the cleaning product including precautions you should take when applying the product, such as wearing gloves or eye protection and making sure you have good ventilation during use of the product. Remove gloves and wash hands immediately after cleaning.  Monitor yourself for signs and symptoms of illness Caregivers and household members are considered close contacts, should monitor their health, and will be asked to limit movement outside of the home to the extent possible. Follow the monitoring steps for close contacts listed on the symptom monitoring form.   ? If you have additional questions, contact your local health department or call the epidemiologist on call at [phone] (available 24/7). ? This guidance is subject to change. For the most up-to-date guidance from [facility], please refer to their website: YouBlogs.pl   Increase activity slowly   Complete by:  As directed    MyChart COVID-19 home monitoring program   Complete by:  Sep 11, 2018    Is the patient willing to use the [place] for home monitoring?:  Yes   Temperature monitoring   Complete by:  Sep 11, 2018    After how many days would you like to receive a notification of this patient's flowsheet  entries?:  1     Allergies as of 09/11/2018      Reactions   Hydrocodone Itching   Other Itching   Ranch dressing ingredient   Pravastatin Other (See Comments)   Muscle aches and pains   Sulfa Antibiotics Other (See Comments)   Heaviness in arms      Medication List    STOP taking these medications   lisinopril 20 MG tablet Commonly known as:  ZESTRIL     TAKE these medications   acyclovir 400 MG tablet Commonly known as:  ZOVIRAX Take 400 mg by mouth 2 (two) times daily as needed (for 5 days).   amoxicillin-clavulanate 875-125 MG tablet Commonly known as:  Augmentin Take 1 tablet by mouth 2 (two) times daily for 4 doses.   ascorbic acid 500 MG tablet Commonly known as:  VITAMIN C Take 1 tablet (500 mg total) by mouth daily. Start taking  on:  Sep 12, 2018   benzonatate 100 MG capsule Commonly known as:  Best boy Take 1 capsule (100 mg total) by mouth every 6 (six) hours as needed for cough.   buPROPion 150 MG 24 hr tablet Commonly known as:  WELLBUTRIN XL Take 450 mg by mouth at bedtime.   CINNAMON PO Take 1 capsule by mouth at bedtime.   Lysine 1000 MG Tabs Take 1,000 mg by mouth daily.   Melatonin 5 MG Tabs Take 5 mg by mouth at bedtime.   omega-3 acid ethyl esters 1 g capsule Commonly known as:  LOVAZA Take 1 g by mouth at bedtime.   omeprazole 40 MG capsule Commonly known as:  PRILOSEC Take 40 mg by mouth at bedtime.   ondansetron 4 MG tablet Commonly known as:  Zofran Take 1 tablet (4 mg total) by mouth daily as needed for nausea or vomiting.   traZODone 100 MG tablet Commonly known as:  DESYREL Take 100 mg by mouth at bedtime.   zinc sulfate 220 (50 Zn) MG capsule Take 1 capsule (220 mg total) by mouth daily. Start taking on:  Sep 12, 2018      Follow-up Information    Charletta Cousin, MD. Schedule an appointment as soon as possible for a visit in 1 week(s).   Specialty:  Family Medicine Contact information: [address], [place] [address] [phone]          Allergies  Allergen Reactions  . Hydrocodone Itching  . Other Itching    Ranch dressing ingredient  . Pravastatin Other (See Comments)    Muscle aches and pains  . Sulfa Antibiotics Other (See Comments)    Heaviness in arms      Consultations:   None   Other Procedures/Studies: Dg Chest Port 1 View  Result Date: 09/09/2018 CLINICAL DATA:  Shortness of breath. Chest and throat pain. Intermittent fevers. Reportedly positive test for COVID-19. EXAM: PORTABLE CHEST 1 VIEW COMPARISON:  Chest radiographs 05/26/2015 and CTA 05/26/2015 FINDINGS: The cardiomediastinal silhouette is within normal limits. Lung volumes are lower than on the prior radiographs with mild opacity noted in the retrocardiac left lower lobe. No pleural effusion or pneumothorax is identified. No acute osseous abnormality is seen. IMPRESSION: Low lung volumes with mild left lower lobe opacity which may reflect infection or atelectasis. Electronically Signed   By: Logan Bores M.D.   On: 09/09/2018 17:07     TODAY-DAY OF DISCHARGE:  Subjective:   Elizabeth Schneider today has no headache,no chest abdominal pain,no new weakness tingling or numbness, feels much better wants to go home today.   Objective:   Blood pressure (!) 96/57, pulse 74, temperature 98 F (36.7 C), temperature source Oral, resp. rate 18, height 5\' 2"  (1.575 m), weight 95.3 kg, SpO2 96 %.  Intake/Output Summary (Last 24 hours) at 09/11/2018 1118 Last data filed at 09/11/2018 0830 Gross per 24 hour  Intake 620 ml  Output -  Net 620 ml   Filed Weights   09/09/18 1537 09/09/18 2230  Weight: 99.8 kg 95.3 kg    Exam: Awake Alert, Oriented *3, No new F.N deficits, Normal affect [place].AT,PERRAL Supple Neck,No JVD, No cervical lymphadenopathy appriciated.  Symmetrical Chest wall movement, Good air movement bilaterally, CTAB RRR,No Gallops,Rubs or new Murmurs, No Parasternal Heave +ve B.Sounds, Abd Soft, Non  tender, No organomegaly appriciated, No rebound -guarding or rigidity. No Cyanosis, Clubbing or edema, No new Rash or bruise   PERTINENT RADIOLOGIC STUDIES:  Dg Chest Port 1 View  Result Date: 09/09/2018 CLINICAL DATA:  Shortness of breath. Chest and throat pain. Intermittent fevers. Reportedly positive test for COVID-19. EXAM: PORTABLE CHEST 1 VIEW COMPARISON:  Chest radiographs 05/26/2015 and CTA 05/26/2015 FINDINGS: The cardiomediastinal silhouette is within normal limits. Lung volumes are lower than on the prior radiographs with mild opacity noted in the retrocardiac left lower lobe. No pleural effusion or pneumothorax is identified. No acute osseous abnormality is seen. IMPRESSION: Low lung volumes with mild left lower lobe opacity which may reflect infection or atelectasis. Electronically Signed   By: Logan Bores M.D.   On: 09/09/2018 17:07     PERTINENT LAB RESULTS: CBC: Recent Labs    09/10/18 1148 09/11/18 0400  WBC 3.3* 3.0*  HGB 11.9* 11.7*  HCT 37.6 38.2  PLT 148* 157   CMET CMP     Component Value Date/Time   NA 140 09/11/2018 0400   K 3.9 09/11/2018 0400   CL 106 09/11/2018 0400   CO2 29 09/11/2018 0400   GLUCOSE 97 09/11/2018 0400   BUN 12 09/11/2018 0400   CREATININE 0.86 09/11/2018 0400   CALCIUM 8.4 (L) 09/11/2018 0400   PROT 6.7 09/11/2018 0400   ALBUMIN 3.2 (L) 09/11/2018 0400   AST 29 09/11/2018 0400   ALT 21 09/11/2018 0400   ALKPHOS 56 09/11/2018 0400   BILITOT 0.4 09/11/2018 0400   GFRNONAA >60 09/11/2018 0400   GFRAA >60 09/11/2018 0400    GFR Estimated Creatinine Clearance: 80.5 mL/min (by C-G formula based on SCr of 0.86 mg/dL). No results for input(s): LIPASE, AMYLASE in the last 72 hours. Recent Labs    09/09/18 1557  TROPONINI <0.03   Invalid input(s): POCBNP Recent Labs    09/10/18 1148 09/11/18 0400  DDIMER 1.50* 1.06*   No results for input(s): HGBA1C in the last 72 hours. No results for input(s): CHOL, HDL, LDLCALC, TRIG,  CHOLHDL, LDLDIRECT in the last 72 hours. No results for input(s): TSH, T4TOTAL, T3FREE, THYROIDAB in the last 72 hours.  Invalid input(s): FREET3 Recent Labs    09/10/18 1148 09/11/18 0400  FERRITIN 58 51   Coags: No results for input(s): INR in the last 72 hours.  Invalid input(s): PT Microbiology: Recent Results (from the past 240 hour(s))  Urine culture     Status: None   Collection Time: 09/09/18  4:08 PM  Result Value Ref Range Status   Specimen Description URINE, CLEAN CATCH  Final   Special Requests   Final    NONE Performed at [facility], [place] [address] [place], [place] [address]    Culture   Final    Multiple bacterial morphotypes present, none predominant. Suggest appropriate recollection if clinically indicated.   Report Status 09/10/2018 FINAL  Final  Blood culture (routine x 2)     Status: None (Preliminary result)   Collection Time: 09/09/18  6:26 PM  Result Value Ref Range Status   Specimen Description BLOOD LEFT ANTECUBITAL  Final   Special Requests   Final    BOTTLES DRAWN AEROBIC AND ANAEROBIC Blood Culture adequate volume   Culture   Final    NO GROWTH 2 DAYS Performed at [facility], [place] [address] [place], [place] [address]    Report Status PENDING  Incomplete  Blood culture (routine x 2)     Status: None (Preliminary result)   Collection Time: 09/09/18  8:13 PM  Result Value Ref Range Status   Specimen Description BLOOD  RIGHT UPPER ARM  Final   Special Requests   Final    BOTTLES DRAWN AEROBIC AND ANAEROBIC Blood Culture adequate volume   Culture   Final    NO GROWTH 2 DAYS Performed at [facility], [address], [place], [place] [address]    Report Status PENDING  Incomplete    FURTHER DISCHARGE INSTRUCTIONS:  Get Medicines reviewed and adjusted: Please take all your medications with you for your next visit with your Primary MD  Laboratory/radiological data: Please request your Primary MD to go over all hospital  tests and procedure/radiological results at the follow up, please ask your Primary MD to get all Hospital records sent to his/her office.  In some cases, they will be blood work, cultures and biopsy results pending at the time of your discharge. Please request that your primary care M.D. goes through all the records of your hospital data and follows up on these results.  Also Note the following: If you experience worsening of your admission symptoms, develop shortness of breath, life threatening emergency, suicidal or homicidal thoughts you must seek medical attention immediately by calling 911 or calling your MD immediately  if symptoms less severe.  You must read complete instructions/literature along with all the possible adverse reactions/side effects for all the Medicines you take and that have been prescribed to you. Take any new Medicines after you have completely understood and accpet all the possible adverse reactions/side effects.   Do not drive when taking Pain medications or sleeping medications (Benzodaizepines)  Do not take more than prescribed Pain, Sleep and Anxiety Medications. It is not advisable to combine anxiety,sleep and pain medications without talking with your primary care practitioner  Special Instructions: If you have smoked or chewed Tobacco  in the last 2 yrs please stop smoking, stop any regular Alcohol  and or any Recreational drug use.  Wear Seat belts while driving.  Please note: You were cared for by a hospitalist during your hospital stay. Once you are discharged, your primary care physician will handle any further medical issues. Please note that NO REFILLS for any discharge medications will be authorized once you are discharged, as it is imperative that you return to your primary care physician (or establish a relationship with a primary care physician if you do not have one) for your post hospital discharge needs so that they can reassess your need for  medications and monitor your lab values.  Total Time spent coordinating discharge including counseling, education and face to face time equals 35 minutes.  Signed: Milon Dethloff 09/11/2018 11:18 AM

## 2018-09-11 NOTE — Discharge Instructions (Signed)
Person Under Monitoring Name: Elizabeth Schneider  Location: [address] [place] [address]   Infection Prevention Recommendations for Individuals Confirmed to have, or Being Evaluated for, 2019 Novel Coronavirus (COVID-19) Infection Who Receive Care at Home  Individuals who are confirmed to have, or are being evaluated for, COVID-19 should follow the prevention steps below until a healthcare provider or local or state health department says they can return to normal activities.  Stay home except to get medical care You should restrict activities outside your home, except for getting medical care. Do not go to work, school, or public areas, and do not use public transportation or taxis.  Call ahead before visiting your doctor Before your medical appointment, call the healthcare provider and tell them that you have, or are being evaluated for, COVID-19 infection. This will help the healthcare providers office take steps to keep other people from getting infected. Ask your healthcare provider to call the local or state health department.  Monitor your symptoms Seek prompt medical attention if your illness is worsening (e.g., difficulty breathing). Before going to your medical appointment, call the healthcare provider and tell them that you have, or are being evaluated for, COVID-19 infection. Ask your healthcare provider to call the local or state health department.  Wear a facemask You should wear a facemask that covers your nose and mouth when you are in the same room with other people and when you visit a healthcare provider. People who live with or visit you should also wear a facemask while they are in the same room with you.  Separate yourself from other people in your home As much as possible, you should stay in a different room from other people in your home. Also, you should use a separate bathroom, if available.  Avoid sharing household items You should not  share dishes, drinking glasses, cups, eating utensils, towels, bedding, or other items with other people in your home. After using these items, you should wash them thoroughly with soap and water.  Cover your coughs and sneezes Cover your mouth and nose with a tissue when you cough or sneeze, or you can cough or sneeze into your sleeve. Throw used tissues in a lined trash can, and immediately wash your hands with soap and water for at least 20 seconds or use an alcohol-based hand rub.  Wash your [facility] your hands often and thoroughly with soap and water for at least 20 seconds. You can use an alcohol-based hand sanitizer if soap and water are not available and if your hands are not visibly dirty. Avoid touching your eyes, nose, and mouth with unwashed hands.   Prevention Steps for Caregivers and Household Members of Individuals Confirmed to have, or Being Evaluated for, COVID-19 Infection Being Cared for in the Home  If you live with, or provide care at home for, a person confirmed to have, or being evaluated for, COVID-19 infection please follow these guidelines to prevent infection:  Follow healthcare providers instructions Make sure that you understand and can help the patient follow any healthcare provider instructions for all care.  Provide for the patients basic needs You should help the patient with basic needs in the home and provide support for getting groceries, prescriptions, and other personal needs.  Monitor the patients symptoms If they are getting sicker, call his or her medical provider and tell them that the patient has, or is being evaluated for, COVID-19 infection. This will help the healthcare providers office  take steps to keep other people from getting infected. Ask the healthcare provider to call the local or state health department.  Limit the number of people who have contact with the patient  If possible, have only one caregiver for the  patient.  Other household members should stay in another home or place of residence. If this is not possible, they should stay  in another room, or be separated from the patient as much as possible. Use a separate bathroom, if available.  Restrict visitors who do not have an essential need to be in the home.  Keep older adults, very young children, and other sick people away from the patient Keep older adults, very young children, and those who have compromised immune systems or chronic health conditions away from the patient. This includes people with chronic heart, lung, or kidney conditions, diabetes, and cancer.  Ensure good ventilation Make sure that shared spaces in the home have good air flow, such as from an air conditioner or an opened window, weather permitting.  Wash your hands often  Wash your hands often and thoroughly with soap and water for at least 20 seconds. You can use an alcohol based hand sanitizer if soap and water are not available and if your hands are not visibly dirty.  Avoid touching your eyes, nose, and mouth with unwashed hands.  Use disposable paper towels to dry your hands. If not available, use dedicated cloth towels and replace them when they become wet.  Wear a facemask and gloves  Wear a disposable facemask at all times in the room and gloves when you touch or have contact with the patients blood, body fluids, and/or secretions or excretions, such as sweat, saliva, sputum, nasal mucus, vomit, urine, or feces.  Ensure the mask fits over your nose and mouth tightly, and do not touch it during use.  Throw out disposable facemasks and gloves after using them. Do not reuse.  Wash your hands immediately after removing your facemask and gloves.  If your personal clothing becomes contaminated, carefully remove clothing and launder. Wash your hands after handling contaminated clothing.  Place all used disposable facemasks, gloves, and other waste in a lined  container before disposing them with other household waste.  Remove gloves and wash your hands immediately after handling these items.  Do not share dishes, glasses, or other household items with the patient  Avoid sharing household items. You should not share dishes, drinking glasses, cups, eating utensils, towels, bedding, or other items with a patient who is confirmed to have, or being evaluated for, COVID-19 infection.  After the person uses these items, you should wash them thoroughly with soap and water.  Wash laundry thoroughly  Immediately remove and wash clothes or bedding that have blood, body fluids, and/or secretions or excretions, such as sweat, saliva, sputum, nasal mucus, vomit, urine, or feces, on them.  Wear gloves when handling laundry from the patient.  Read and follow directions on labels of laundry or clothing items and detergent. In general, wash and dry with the warmest temperatures recommended on the label.  Clean all areas the individual has used often  Clean all touchable surfaces, such as counters, tabletops, doorknobs, bathroom fixtures, toilets, phones, keyboards, tablets, and bedside tables, every day. Also, clean any surfaces that may have blood, body fluids, and/or secretions or excretions on them.  Wear gloves when cleaning surfaces the patient has come in contact with.  Use a diluted bleach solution (e.g., dilute bleach with 1 part  bleach and 10 parts water) or a household disinfectant with a label that says EPA-registered for coronaviruses. To make a bleach solution at home, add 1 tablespoon of bleach to 1 quart (4 cups) of water. For a larger supply, add  cup of bleach to 1 gallon (16 cups) of water.  Read labels of cleaning products and follow recommendations provided on product labels. Labels contain instructions for safe and effective use of the cleaning product including precautions you should take when applying the product, such as wearing gloves or  eye protection and making sure you have good ventilation during use of the product.  Remove gloves and wash hands immediately after cleaning.  Monitor yourself for signs and symptoms of illness Caregivers and household members are considered close contacts, should monitor their health, and will be asked to limit movement outside of the home to the extent possible. Follow the monitoring steps for close contacts listed on the symptom monitoring form.   ? If you have additional questions, contact your local health department or call the epidemiologist on call at [phone] (available 24/7). ? This guidance is subject to change. For the most up-to-date guidance from [facility], please refer to their website: [email or web address]      Person Under Monitoring Name: Elizabeth Schneider  Location: [address] [place] [address]   CORONAVIRUS DISEASE 2019 (COVID-19) Guidance for Persons Under Investigation You are being tested for the virus that causes coronavirus disease 2019 (COVID-19). Public health actions are necessary to ensure protection of your health and the health of others, and to prevent further spread of infection. COVID-19 is caused by a virus that can cause symptoms, such as fever, cough, and shortness of breath. The primary transmission from person to person is by coughing or sneezing. On June 08, 2018, the [place] announced a [employer] Emergency of International Concern and on June 09, 2018 the U.S. Department of Health and Human Services declared a public health emergency. If the virus that causesCOVID-19 spreads in the community, it could have severe public health consequences.  As a person under investigation for COVID-19, the [facility] advises you to adhere to the following guidance until your test results are reported to  you. If your test result is positive, you will receive additional information from your provider and your local health department at that time.   Remain at home until you are cleared by your health provider or public health authorities.   Keep a log of visitors to your home using the form provided. Any visitors to your home must be aware of your isolation status.  If you plan to move to a new address or leave the county, notify the local health department in your county.  Call a doctor or seek care if you have an urgent medical need. Before seeking medical care, call ahead and get instructions from the provider before arriving at the medical office, clinic or hospital. Notify them that you are being tested for the virus that causes COVID-19 so arrangements can be made, as necessary, to prevent transmission to others in the healthcare setting. Next, notify the local health department in your county.  If a medical emergency arises and you need to call 911, inform the first responders that you are being tested for the virus that causes COVID-19. Next, notify the local health department in your county.  Adhere to all guidance set forth by the [facility]  of Public Health for Home Care of patients that is based on guidance from the Center for Disease Control and Prevention with suspected or confirmed COVID-19. It is provided with this guidance for Persons Under Investigation.  Your health and the health of our community are our top priorities. Public Health officials remain available to provide assistance and counseling to you about COVID-19 and compliance with this guidance.  Provider: ____________________________________________________________ Date: ______/_____/_________  By signing below, you acknowledge that you have read and agree to comply with this Guidance for Persons Under Investigation. ______________________________________________________________ Date:  ______/_____/_________  WHO DO I CALL? You can find a list of local health departments here: [email or web address] Health Department: ____________________________________________________________________ Contact Name: ________________________________________________________________________ Telephone: ___________________________________________________________________________  Marice Potter, [place], Communicable Disease Branch COVID-19 Guidance for Persons Under Investigation July 15, 2018

## 2018-09-11 NOTE — Progress Notes (Signed)
Patient given discharge instructions, and verbalized an understanding of all discharge instructions.  Patient agrees with discharge plan, and is being discharged in stable medical condition.  Patient to be given transportation via wheelchair, when husband arrives to pick up.

## 2018-09-12 ENCOUNTER — Telehealth (INDEPENDENT_AMBULATORY_CARE_PROVIDER_SITE_OTHER): Payer: Self-pay | Admitting: Family Medicine

## 2018-09-12 LAB — GLUCOSE 6 PHOSPHATE DEHYDROGENASE
G6PDH: 8.2 U/g{Hb} (ref 4.6–13.5)
Hemoglobin: 14 g/dL (ref 11.1–15.9)

## 2018-09-12 NOTE — Telephone Encounter (Signed)
Called and spoke with pt. She is "working on it" when I asked how she is feeling. Discussed MyChart and texted link to pt. She will try to sign up and complete [facility] Monitoring.

## 2018-09-13 NOTE — Hospital Discharge Follow-Up (Addendum)
09/13/18 @ 1519: Called and spoke with patient (verified identity) to discuss how she has been feeling since she transitioned home on 09/11/18. Patient states "I'm feeling better and have been using my breathing thing, I'm getting better not worse." CM instructed patient to contact her PCP if her condition worsens, with the patient verbalizing understanding.   Midge Minium RN, BSN, NCM-BC, ACM-RN [phone]

## 2018-09-14 LAB — CULTURE, BLOOD (ROUTINE X 2)
Culture: NO GROWTH
Culture: NO GROWTH
Special Requests: ADEQUATE
Special Requests: ADEQUATE

## 2018-09-20 NOTE — Telephone Encounter (Signed)
LM for pt to call me if she would like help to complete [facility] Monitoring Questionnaire. Pt did signup for MyChart.

## 2020-07-13 IMAGING — DX PORTABLE CHEST - 1 VIEW
1 series · 1 of 1 positions shown · non-contrast
Comparison: Chest radiographs 05/26/2015 and CTA 05/26/2015

CLINICAL DATA: Shortness of breath. Chest and throat pain.
Intermittent fevers. Reportedly positive test for [id number].

EXAM:
PORTABLE CHEST 1 VIEW

[chest ap]
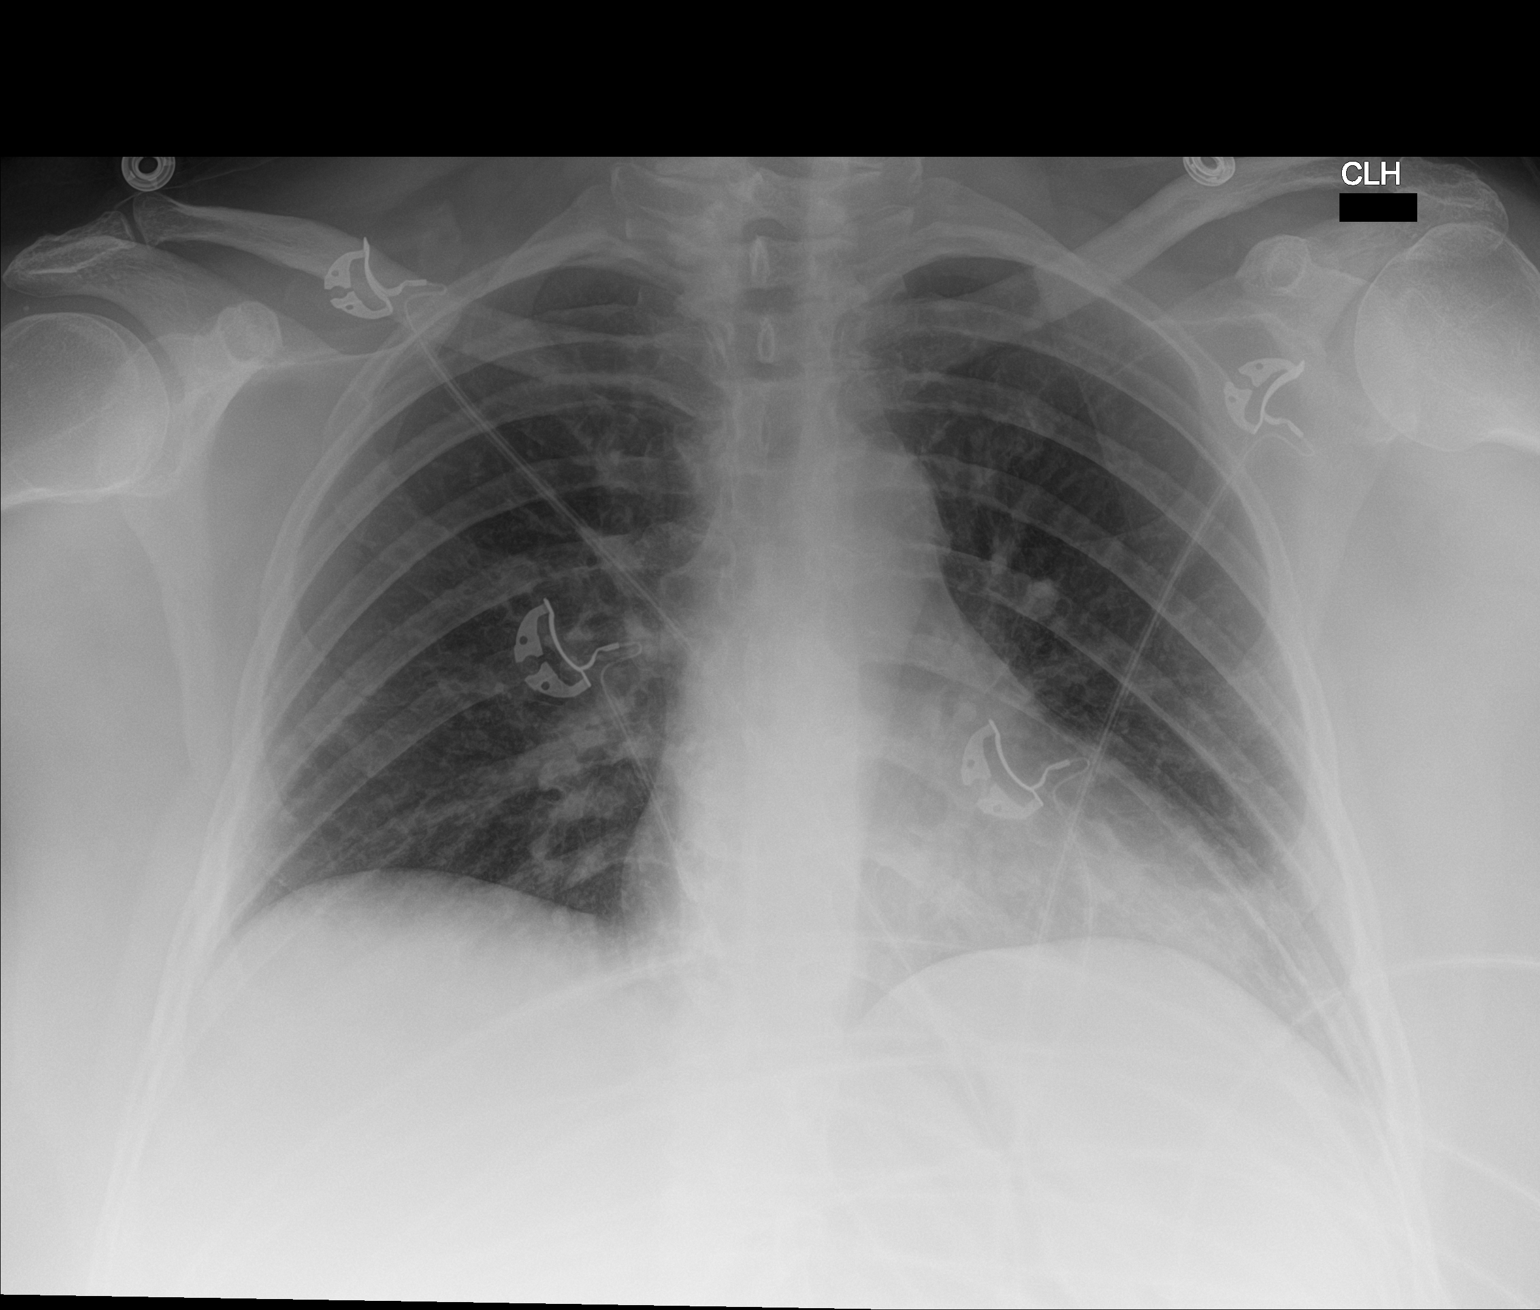

[1 of 1 positions shown; findings below may reference images not displayed]

FINDINGS: The cardiomediastinal silhouette is within normal limits. Lung
volumes are lower than on the prior radiographs with mild opacity
noted in the retrocardiac left lower lobe. No pleural effusion or
pneumothorax is identified. No acute osseous abnormality is seen.
IMPRESSION: Low lung volumes with mild left lower lobe opacity which may reflect
infection or atelectasis.

## 2020-10-29 ENCOUNTER — Other Ambulatory Visit: Payer: Self-pay

## 2020-10-29 ENCOUNTER — Ambulatory Visit: Payer: 59 | Admitting: Obstetrics and Gynecology

## 2020-10-29 ENCOUNTER — Other Ambulatory Visit (HOSPITAL_COMMUNITY)
Admission: RE | Admit: 2020-10-29 | Discharge: 2020-10-29 | Disposition: A | Discharge status: Home / Self Care | Payer: 59 | Source: Ambulatory Visit | Attending: Obstetrics and Gynecology | Admitting: Obstetrics and Gynecology

## 2020-10-29 ENCOUNTER — Encounter: Payer: Self-pay | Admitting: Obstetrics and Gynecology

## 2020-10-29 VITALS — BP 126/60 | HR 111 | Ht 62.0 in | Wt 227.0 lb

## 2020-10-29 DIAGNOSIS — N760 Acute vaginitis: Secondary | ICD-10-CM

## 2020-10-29 MED ORDER — NYSTATIN-TRIAMCINOLONE 100000-0.1 UNIT/GM-% EX CREA: 1.0000 "application " | TOPICAL_CREAM | Freq: Two times a day (BID) | CUTANEOUS | 0 refills | Status: AC
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
## 2020-10-29 NOTE — Patient Instructions (Signed)
Lichen Sclerosus Lichen sclerosus is a skin problem. It can happen on any part of the body, but it commonly involves the anal and genital areas. It can cause itching and discomfort in these areas. Treatment can help to control symptoms. When the genital area is affected, getting treatment is important because the conditioncan cause scarring that may lead to other problems if left untreated. What are the causes? The cause of this condition is not known. It may be related to an overactive immune system or a lack of certain hormones. Lichen sclerosus is not an infection or a fungus, and it is not passed from one person to another (non-contagious). What increases the risk? The following factors may make you more likely to develop this condition: You are a woman who has reached menopause. You are a man who was not circumcised. This condition may also develop for the first time in children, usually beforethey enter puberty. What are the signs or symptoms? Symptoms of this condition include: White areas (plaques) on the skin that may be thin and wrinkled, or thickened. Red and swollen patches (lesions) on the skin. Tears or cracks in the skin. Bruising. Blood blisters. Severe itching. Pain, itching, or burning when urinating. Constipation is also common in children with lichen sclerosus, but can be seenin adults. How is this diagnosed? This condition may be diagnosed with a physical exam. In some cases, a tissue sample may be removed to be checked under a microscope (biopsy). How is this treated? This condition may be treated with: Topical steroids. These are medicated creams or ointments that are applied over the affected areas. Medicines that are taken by mouth. Topical immunotherapy. These are medicated creams or ointments that are applied over the affected areas. They stimulate your immune system to fight the skin condition. This may be used if steroids are not effective. Surgery. This may be  needed in more severe cases that are causing problems such as scarring. Follow these instructions at home: Medicines Take over-the-counter and prescription medicines only as told by your health care provider. Use creams or ointments as told by your health care provider. Skin care Do not scratch the affected areas of skin. If you are a woman, be sure to keep the vaginal area as clean and dry as possible. Clean the affected area of skin gently with water only. Pat skin dry and avoid the use of rough towels or toilet paper. Avoid irritating skin products, including soap and scented lotions. Use emollient creams as directed by your health care provider to help reduce itching. General instructions Keep all follow-up visits. This is important. Your condition may cause constipation. To prevent or treat constipation, you may need to: Drink enough fluid to keep your urine pale yellow. Take over-the-counter or prescription medicines. Eat foods that are high in fiber, such as beans, whole grains, and fresh fruits and vegetables. Limit foods that are high in fat and processed sugars, such as fried or sweet foods. Contact a health care provider if: You have increasing redness, swelling, or pain in the affected area. You have fluid, blood, or pus coming from the affected area. You have new lesions on your skin. You have a fever. You have pain during sex. Get help right away if: You develop severe pain or burning in the affected areas, especially in the genital area. Summary Lichen sclerosus is a skin problem. When the genital area is affected, getting treatment is important because the condition can cause scarring that may lead to other  problems if left untreated. This condition is usually treated with medicated creams or ointments (topical steroids) that are applied over the affected areas. Take or use over-the-counter and prescription medicines only as told by your health care provider. Contact a  health care provider if you have new lesions on your skin, have pain during sex, or have increasing redness, swelling, or pain in the affected area. Keep all follow-up visits. This is important. This information is not intended to replace advice given to you by your health care provider. Make sure you discuss any questions you have with your healthcare provider. Document Revised: 09/08/2019 Document Reviewed: 09/08/2019 Elsevier Patient Education  [place].

## 2020-10-30 LAB — CERVICOVAGINAL ANCILLARY ONLY
Bacterial Vaginitis (gardnerella): NEGATIVE
Candida Glabrata: POSITIVE — AB
Candida Vaginitis: NEGATIVE
Comment: NEGATIVE
Comment: NEGATIVE
Comment: NEGATIVE
Comment: NEGATIVE
Trichomonas: NEGATIVE

## 2020-11-21 ENCOUNTER — Ambulatory Visit: Payer: 59 | Admitting: Obstetrics and Gynecology

## 2020-11-21 ENCOUNTER — Other Ambulatory Visit: Payer: Self-pay

## 2020-11-21 ENCOUNTER — Encounter: Payer: Self-pay | Admitting: Obstetrics and Gynecology

## 2020-11-21 ENCOUNTER — Other Ambulatory Visit (HOSPITAL_COMMUNITY)
Admission: RE | Admit: 2020-11-21 | Discharge: 2020-11-21 | Disposition: A | Discharge status: Home / Self Care | Payer: 59 | Source: Ambulatory Visit | Attending: Obstetrics and Gynecology | Admitting: Obstetrics and Gynecology

## 2020-11-21 VITALS — BP 124/80

## 2020-11-21 DIAGNOSIS — B379 Candidiasis, unspecified: Secondary | ICD-10-CM

## 2020-11-21 DIAGNOSIS — L900 Lichen sclerosus et atrophicus: Secondary | ICD-10-CM

## 2020-11-21 DIAGNOSIS — N763 Subacute and chronic vulvitis: Secondary | ICD-10-CM | POA: Insufficient documentation

## 2020-11-21 DIAGNOSIS — N952 Postmenopausal atrophic vaginitis: Secondary | ICD-10-CM

## 2020-11-21 MED ORDER — NONFORMULARY OR COMPOUNDED ITEM: 0 refills | Status: AC

## 2020-11-21 MED ORDER — NONFORMULARY OR COMPOUNDED ITEM: 0 refills | Status: DC

## 2020-11-21 NOTE — Progress Notes (Signed)
GYNECOLOGY  VISIT   HPI: 55 y.o.   Married  Caucasian  female   G3P3 with Patient's last menstrual period was 05/10/2017 (approximate).   here for   Vulvar biopsy for vulvitis.   She received Rx for Mycolog II for suspected yeast infection and it caused burning of her skin. Her vaginitis testing was positive for candida glabrata, and she reports she did not receive contact from the office with this test result.  Thus, she did not start boric aid vaginal suppositories for treatment.   Her last A1C was 8.  Wants to treat vaginal atrophy.  Prefers Vagifem treatment.   GYNECOLOGIC HISTORY: Patient's last menstrual period was 05/10/2017 (approximate). Contraception:  BTL Menopausal hormone therapy:  none Last mammogram:  2020,  mammogram done at [facility] this month and was normal. Last pap smear:   2020        OB History     Gravida  3   Para  3   Term      Preterm      AB      Living  3      SAB      IAB      Ectopic      Multiple      Live Births                 Patient Active Problem List   Diagnosis Date Noted   2019 novel coronavirus disease (COVID-19) 09/09/2018   COVID-19 virus detected 09/09/2018   Pain in the chest    SOB (shortness of breath)    Diabetes mellitus type 2 in obese ([place]) 05/27/2015   Fatigue 05/27/2015   Generalized weakness 05/27/2015   Chest pain 05/26/2015   Syncope 05/26/2015    Past Medical History:  Diagnosis Date   Anxiety    Chronic back pain    "mid and lower" (05/27/2015)   Chronic bronchitis ([place])    COVID 08/2018   Depression    Fibroid    Fibromyalgia    GERD (gastroesophageal reflux disease)    Heart murmur    "clicking murmur"   Hypertension    Lupus ([place])    Obesity    Type II diabetes mellitus ([place])     Past Surgical History:  Procedure Laterality Date   APPENDECTOMY  ~ [place]; 1987; [place]  2016   LAPAROSCOPIC CHOLECYSTECTOMY  2000   TUBAL  LIGATION  1991    Current Outpatient Medications  Medication Sig Dispense Refill   acyclovir (ZOVIRAX) 400 MG tablet Take 400 mg by mouth 2 (two) times daily as needed (for 5 days).   0   atorvastatin (LIPITOR) 40 MG tablet Take by mouth.     buPROPion (WELLBUTRIN XL) 150 MG 24 hr tablet Take 450 mg by mouth at bedtime.     CINNAMON PO Take 1 capsule by mouth at bedtime.     lisinopril (ZESTRIL) 40 MG tablet Take 40 mg by mouth daily.     Lysine 1000 MG TABS Take 1,000 mg by mouth daily.     Melatonin 5 MG TABS Take 5 mg by mouth at bedtime.      meloxicam (MOBIC) 15 MG tablet Take 1 tablet by mouth daily.     nystatin-triamcinolone (MYCOLOG II) cream Apply 1 application topically 2 (two) times daily. Apply to affected area BID for up to 7 days. 60 g 0   omega-3 acid  ethyl esters (LOVAZA) 1 g capsule Take 1 g by mouth at bedtime.     omeprazole (PRILOSEC) 40 MG capsule Take 40 mg by mouth at bedtime.      TRULICITY 1.5 GH/8.2XH SOPN Inject into the skin.     vitamin C (VITAMIN C) 500 MG tablet Take 1 tablet (500 mg total) by mouth daily. 15 tablet 0   zinc sulfate 220 (50 Zn) MG capsule Take 1 capsule (220 mg total) by mouth daily. 15 capsule 0   NONFORMULARY OR COMPOUNDED ITEM Vaginal boric acid suppository.  Place 600 mg suppository in vagina at bedtime nightly for 2 weeks.  BORIC ACID IS FATAL IF TAKEN ORALLY. 14 each 0   No current facility-administered medications for this visit.     ALLERGIES: Hydrocodone, Other, Pravastatin, and Sulfa antibiotics  Family History  Problem Relation Age of Onset   Hypertension Father    Heart disease Father    Cancer Sister     Social History   Socioeconomic History   Marital status: Married    Spouse name: Not on file   Number of children: Not on file   Years of education: Not on file   Highest education level: Not on file  Occupational History   Not on file  Tobacco Use   Smoking status: Never   Smokeless tobacco: Never  Substance  and Sexual Activity   Alcohol use: Not Currently   Drug use: No   Sexual activity: Not Currently    Birth control/protection: Post-menopausal, Surgical    Comment: tubal  Other Topics Concern   Not on file  Social History Narrative   Not on file   Social Determinants of Health   Financial Resource Strain: Not on file  Food Insecurity: Not on file  Transportation Needs: Not on file  Physical Activity: Not on file  Stress: Not on file  Social Connections: Not on file  Intimate Partner Violence: Not on file    Review of Systems  See HPI.  PHYSICAL EXAMINATION:    BP 124/80 (BP Location: Right Arm, Patient Position: Sitting, Cuff Size: Large)   LMP 05/10/2017 (Approximate)     General appearance: alert, cooperative and appears stated age  Pelvic: External genitalia:  perineum and bilateral perianal regions with white coloration of tissue and skin breakdown and ulceration.   Coalescing labia minora and majora.               Urethra:  normal appearing urethra with no masses, tenderness or lesions            Procedure Vulvar biopsies.  Hibiclens prep. Local 1% lidocaine lot [id number], exp May 10, 2022. Perineum and left medial buttock biopsy.  3 mm punch biopsies of each location sent to pathology separately.  Vicryl 3/0 sutures.  Minimal EBL.  No complications.   Chaperone was present for exam.  ASSESSMENT  Vulvovaginitis.  I suspect lichen sclerosus.  Has candida glabrata which is untreated.  Vaginal atrophy.  DM with elevated A1C.  Lupus.   PLAN  Boric acid suppositories 600 mg nightly for 2 weeks to [place].   Fu biopsies.  I anticipate treatment with Clobetasol ointment 0.5% to skin bid for 1 month and then reduce to twice weekly at hs.  Will need to wait for final biopsy result to prescribe this.  Get mammogram report.  If normal, will start Vagifem 10 mcg.  She will likely need a follow up in 6 weeks.   30 min  total time was spent for this  patient encounter, including preparation, face-to-face counseling with the patient, coordination of care, and documentation of the encounter.

## 2020-11-21 NOTE — Patient Instructions (Signed)
Vulva Biopsy, Care After This sheet gives you information about how to care for yourself after your procedure. Your doctor may also give you more specific instructions. If youhave problems or questions, contact your doctor. What can I expect after the procedure? After the procedure, it is common to have: Slight bleeding from the biopsy site. Soreness at the biopsy site. Follow these instructions at home: Biopsy site care  Follow instructions from your doctor about how to take care of your biopsy site. Make sure you: Clean the area using water and mild soap two times a day or as told by your doctor. Gently pat the area dry. If you were prescribed an antibiotic ointment, apply it as told by your doctor. Do not stop using the antibiotic even if your condition gets better. Take a warm water bath (sitz bath) as needed to help with pain. A sitz bath is taken while you are sitting down. The water should only come up to your hips and cover your butt. Leave stitches (sutures), skin glue, or skin tape (adhesive) strips in place. They may need to stay in place for 2 weeks or longer. If tape strips get loose and curl up, you may trim the loose edges. Do not remove tape strips completely unless your doctor says it is okay. Check your biopsy area every day for signs of infection. Check for: More redness, swelling, or pain. More fluid or blood. Warmth. Pus or a bad smell. Do not rub the biopsy area after peeing (urinating). Gently pat the area dry, or use a bottle filled with warm water (peri-bottle) to clean the area. Gently wipe from front to back.  Lifestyle Wear loose, cotton underwear. Do not wear tight pants. Do not use a tampon, douche, or put anything in your vagina for at least 1 week or until your doctor says it is okay. Do not have sex for at least 1 week or until your doctor says it is okay. Do not exercise until your doctor says it is okay. Do not swim or use a hot tub until your doctor  says it is okay. You may shower or take a sitz bath. General instructions Take over-the-counter and prescription medicines only as told by your doctor. Use a sanitary pad until the bleeding stops. Keep all follow-up visits as told by your doctor. This is important. Contact a doctor if: You have more redness, swelling, or pain around your biopsy site. You have more fluid or blood coming from your biopsy site. Your biopsy site feels warm when you touch it. Medicines do not help with your pain. Get help right away if: You have a lot of bleeding from the vulva. You have pus or a bad smell coming from your biopsy site. You have a fever. You have pain in the lower belly (abdomen). Summary After the procedure, it is common to have slight bleeding and soreness at the biopsy site. Follow all instructions as told by your doctor. Clean the area with water and mild soap. Do not rub. Pat the area dry. Take sitz baths as needed. Leave any stitches in place. Check your biopsy site for infection. Signs include more redness, swelling, pain, fluid, or blood, or feeling warm when you touch it. Get help right away if you have a lot of bleeding, a fever, pus or a bad smell, or pain in your lower belly. This information is not intended to replace advice given to you by your health care provider. Make sure you discuss any  questions you have with your healthcare provider. Document Revised: 10/27/2017 Document Reviewed: 10/27/2017 Elsevier Patient Education  2022 [employer].

## 2020-11-23 ENCOUNTER — Encounter: Payer: Self-pay | Admitting: Obstetrics and Gynecology

## 2020-11-23 ENCOUNTER — Telehealth: Payer: Self-pay | Admitting: Obstetrics and Gynecology

## 2020-11-23 NOTE — Telephone Encounter (Signed)
Please get copy of 2022 mammogram from [facility].  Patient would like to start Vagifem, and I need to review her mammogram report to be able to prescribe this.   I am also waiting for her vulvar biopsy results from 11/21/20. I suspect she has lichen sclerosis and will start steroid ointment for this.   I would like for her to have a recheck appointment in about 6 weeks after initiating all treatments.

## 2020-11-24 MED ORDER — ESTRADIOL 10 MCG VA TABS: ORAL_TABLET | VAGINAL | 3 refills | Status: AC

## 2020-11-24 NOTE — Telephone Encounter (Signed)
Patient informed, Rx sent.  

## 2020-11-24 NOTE — Telephone Encounter (Signed)
Mammogram report received from [facility], 11/12/20.  Normal result, [id number].    [facility] for Rx for Vagifem 10 mcg. Sig:  place one tablet (10 mcg) per vagina at hs nightly for 2 weeks.  Then place one tablet per vagina at hs two times weekly.  Disp:  34, RF 3 (24 tabs for refills).

## 2020-11-26 LAB — SURGICAL PATHOLOGY

## 2020-11-28 ENCOUNTER — Other Ambulatory Visit: Payer: Self-pay | Admitting: *Deleted

## 2020-11-28 MED ORDER — CLOBETASOL PROPIONATE 0.05 % EX CREA: TOPICAL_CREAM | CUTANEOUS | 1 refills | Status: DC

## 2021-01-21 ENCOUNTER — Ambulatory Visit: Payer: 59 | Admitting: Obstetrics and Gynecology

## 2021-02-20 ENCOUNTER — Encounter: Payer: Self-pay | Admitting: Obstetrics and Gynecology

## 2021-02-20 ENCOUNTER — Other Ambulatory Visit: Payer: Self-pay

## 2021-02-20 ENCOUNTER — Ambulatory Visit: Payer: 59 | Admitting: Obstetrics and Gynecology

## 2021-02-20 VITALS — BP 126/80 | HR 94 | Ht 62.0 in | Wt 227.0 lb

## 2021-02-20 DIAGNOSIS — L900 Lichen sclerosus et atrophicus: Secondary | ICD-10-CM

## 2021-02-20 MED ORDER — CLOBETASOL PROPIONATE 0.05 % EX OINT: 1.0000 "application " | TOPICAL_OINTMENT | Freq: Two times a day (BID) | CUTANEOUS | 1 refills | Status: AC

## 2021-02-20 NOTE — Progress Notes (Signed)
GYNECOLOGY  VISIT   HPI: 55 y.o.   Married  Caucasian  female   G3P3 with Patient's last menstrual period was 05/10/2017 (approximate).   here for Vulvar bx follow up.   Vulvar biopsy 08/05/49 showing lichen slcerosus of the perineum and left buttock area. Rx for clobetasol cream was not used.   Nystatin does not burn and it helps itching externally.  Mycolog cream burned.   Patient was given an Rx for boric acid suppositories for candida glabrata vaginal infection and Rx for Vagifem to treat menopausal atrophy.   GYNECOLOGIC HISTORY: Patient's last menstrual period was 05/10/2017 (approximate). Contraception:  n/a Menopausal hormone therapy:  estradiol  Last mammogram:  11/2020 Last pap smear:   2020        OB History     Gravida  3   Para  3   Term      Preterm      AB      Living  3      SAB      IAB      Ectopic      Multiple      Live Births                 Patient Active Problem List   Diagnosis Date Noted   2019 novel coronavirus disease (COVID-19) 09/09/2018   COVID-19 virus detected 09/09/2018   Pain in the chest    SOB (shortness of breath)    Diabetes mellitus type 2 in obese ([place]) 05/27/2015   Fatigue 05/27/2015   Generalized weakness 05/27/2015   Chest pain 05/26/2015   Syncope 05/26/2015    Past Medical History:  Diagnosis Date   Anxiety    Chronic back pain    "mid and lower" (05/27/2015)   Chronic bronchitis ([place])    COVID 08/2018   Depression    Fibroid    Fibromyalgia    GERD (gastroesophageal reflux disease)    Heart murmur    "clicking murmur"   Hypertension    Lupus ([place])    Obesity    Type II diabetes mellitus ([place])     Past Surgical History:  Procedure Laterality Date   APPENDECTOMY  ~ [place]; 1987; [place]  2016   LAPAROSCOPIC CHOLECYSTECTOMY  2000   TUBAL LIGATION  1991    Current Outpatient Medications  Medication Sig Dispense Refill   acyclovir (ZOVIRAX) 400  MG tablet Take 400 mg by mouth 2 (two) times daily as needed (for 5 days).   0   atorvastatin (LIPITOR) 40 MG tablet Take by mouth.     buPROPion (WELLBUTRIN XL) 150 MG 24 hr tablet Take 450 mg by mouth at bedtime.     CINNAMON PO Take 1 capsule by mouth at bedtime.     clobetasol cream (TEMOVATE) 0.05 % apply to area in a thin layer twice daily for 1 month, then reduce to applying twice a week at bedtime. 30 g 1   Estradiol 10 MCG TABS vaginal tablet Insert 1 tablet vaginally night at bedtime for 2 weeks, then insert 1 tablet vaginally at bedtime twice a week. 34 tablet 3   lisinopril (ZESTRIL) 40 MG tablet Take 40 mg by mouth daily.     Lysine 1000 MG TABS Take 1,000 mg by mouth daily.     Melatonin 5 MG TABS Take 5 mg by mouth at bedtime.      meloxicam (MOBIC) 15 MG  tablet Take 1 tablet by mouth daily.     NONFORMULARY OR COMPOUNDED ITEM Vaginal boric acid suppository.  Place 600 mg suppository in vagina at bedtime nightly for 2 weeks.  BORIC ACID IS FATAL IF TAKEN ORALLY. 14 each 0   nystatin-triamcinolone (MYCOLOG II) cream Apply 1 application topically 2 (two) times daily. Apply to affected area BID for up to 7 days. 60 g 0   omega-3 acid ethyl esters (LOVAZA) 1 g capsule Take 1 g by mouth at bedtime.     omeprazole (PRILOSEC) 40 MG capsule Take 40 mg by mouth at bedtime.      TRULICITY 1.5 BW/6.2MB SOPN Inject into the skin.     vitamin C (VITAMIN C) 500 MG tablet Take 1 tablet (500 mg total) by mouth daily. 15 tablet 0   zinc sulfate 220 (50 Zn) MG capsule Take 1 capsule (220 mg total) by mouth daily. 15 capsule 0   No current facility-administered medications for this visit.     ALLERGIES: Hydrocodone, Other, Pravastatin, and Sulfa antibiotics  Family History  Problem Relation Age of Onset   Hypertension Father    Heart disease Father    Cancer Sister     Social History   Socioeconomic History   Marital status: Married    Spouse name: Not on file   Number of children: Not  on file   Years of education: Not on file   Highest education level: Not on file  Occupational History   Not on file  Tobacco Use   Smoking status: Never   Smokeless tobacco: Never  Substance and Sexual Activity   Alcohol use: Not Currently   Drug use: No   Sexual activity: Not Currently    Birth control/protection: Post-menopausal, Surgical    Comment: tubal  Other Topics Concern   Not on file  Social History Narrative   Not on file   Social Determinants of Health   Financial Resource Strain: Not on file  Food Insecurity: Not on file  Transportation Needs: Not on file  Physical Activity: Not on file  Stress: Not on file  Social Connections: Not on file  Intimate Partner Violence: Not on file    Review of Systems  PHYSICAL EXAMINATION:    BP 126/80 (BP Location: Right Arm, Patient Position: Sitting, Cuff Size: Large)   Pulse 94   Ht 5\' 2"  (1.575 m)   Wt 227 lb (103 kg)   LMP 05/10/2017 (Approximate)   SpO2 98%   BMI 41.52 kg/m     General appearance: alert, cooperative and appears stated age  Pelvic: External genitalia:  hypopigmentation of the labia minora and clitoral region              Urethra:  normal appearing urethra with no masses, tenderness or lesions              Bartholins and Skenes: normal                 Vagina: normal appearing vagina with normal color and discharge, no lesions              Cervix: no lesions                Bimanual Exam:  Uterus:  normal size, contour, position, consistency, mobility, non-tender              Adnexa: no mass, fullness, tenderness  Anus:  perianal region with hypopigmentation.  Chaperone was present for exam:  Kim G.   ASSESSMENT  Lichen sclerosus.  No vaginal atrophy noted on exam today.   PLAN  Discussion of lichen sclerosus.  Written information also to patient.  Rx for Clobetasol ointment 0.05%, apply topically in a thin layer to vulva and perianal region, bid x 2 weeks for a  flare and then reduce usage to twice a week at bedtime.  Follow up for annual exam in June, 2023 and prn.   An After Visit Summary was printed and given to the patient.

## 2021-02-20 NOTE — Patient Instructions (Signed)
Lichen Sclerosus Lichen sclerosus is a skin problem. It can happen on any part of the body, but it commonly involves the anal and genital areas. It can cause itching and discomfort in these areas. Treatment can help to control symptoms. When the genital area is affected, getting treatment is important because the condition can cause scarring that may lead to other problems if left untreated. What are the causes? The cause of this condition is not known. It may be related to an overactive immune system or a lack of certain hormones. Lichen sclerosus is not an infection or a fungus, and it is not passed from one person to another (non-contagious). What increases the risk? The following factors may make you more likely to develop this condition: You are a woman who has reached menopause. You are a man who was not circumcised. This condition may also develop for the first time in children, usually before they enter puberty. What are the signs or symptoms? Symptoms of this condition include: White areas (plaques) on the skin that may be thin and wrinkled, or thickened. Red and swollen patches (lesions) on the skin. Tears or cracks in the skin. Bruising. Blood blisters. Severe itching. Pain, itching, or burning when urinating. Constipation is also common in children with lichen sclerosus, but can be seen in adults. How is this diagnosed? This condition may be diagnosed with a physical exam. In some cases, a tissue sample may be removed to be checked under a microscope (biopsy). How is this treated? This condition may be treated with: Topical steroids. These are medicated creams or ointments that are applied over the affected areas. Medicines that are taken by mouth. Topical immunotherapy. These are medicated creams or ointments that are applied over the affected areas. They stimulate your immune system to fight the skin condition. This may be used if steroids are not effective. Surgery. This may  be needed in more severe cases that are causing problems such as scarring. Follow these instructions at home: Medicines Take over-the-counter and prescription medicines only as told by your health care provider. Use creams or ointments as told by your health care provider. Skin care Do not scratch the affected areas of skin. If you are a woman, be sure to keep the vaginal area as clean and dry as possible. Clean the affected area of skin gently with water only. Pat skin dry and avoid the use of rough towels or toilet paper. Avoid irritating skin products, including soap and scented lotions. Use emollient creams as directed by your health care provider to help reduce itching. General instructions Keep all follow-up visits. This is important. Your condition may cause constipation. To prevent or treat constipation, you may need to: Drink enough fluid to keep your urine pale yellow. Take over-the-counter or prescription medicines. Eat foods that are high in fiber, such as beans, whole grains, and fresh fruits and vegetables. Limit foods that are high in fat and processed sugars, such as fried or sweet foods. Contact a health care provider if: You have increasing redness, swelling, or pain in the affected area. You have fluid, blood, or pus coming from the affected area. You have new lesions on your skin. You have a fever. You have pain during sex. Get help right away if: You develop severe pain or burning in the affected areas, especially in the genital area. Summary Lichen sclerosus is a skin problem. When the genital area is affected, getting treatment is important because the condition can cause scarring that may   lead to other problems if left untreated. This condition is usually treated with medicated creams or ointments (topical steroids) that are applied over the affected areas. Take or use over-the-counter and prescription medicines only as told by your health care provider. Contact a  health care provider if you have new lesions on your skin, have pain during sex, or have increasing redness, swelling, or pain in the affected area. Keep all follow-up visits. This is important. This information is not intended to replace advice given to you by your health care provider. Make sure you discuss any questions you have with your health care provider. Document Revised: 09/08/2019 Document Reviewed: 09/08/2019 Elsevier Patient Education  [place].

## 2024-05-21 ENCOUNTER — Other Ambulatory Visit: Payer: Self-pay | Admitting: Family Medicine

## 2024-05-21 DIAGNOSIS — Z1231 Encounter for screening mammogram for malignant neoplasm of breast: Secondary | ICD-10-CM

## 2024-05-22 ENCOUNTER — Ambulatory Visit (HOSPITAL_BASED_OUTPATIENT_CLINIC_OR_DEPARTMENT_OTHER): Admission: EM | Admit: 2024-05-22 | Discharge: 2024-05-22 | Disposition: A | Discharge status: Home / Self Care

## 2024-05-22 ENCOUNTER — Encounter (HOSPITAL_BASED_OUTPATIENT_CLINIC_OR_DEPARTMENT_OTHER): Payer: Self-pay

## 2024-05-22 DIAGNOSIS — N611 Abscess of the breast and nipple: Secondary | ICD-10-CM

## 2024-05-22 MED ORDER — FLUCONAZOLE 150 MG PO TABS: 150.0000 mg | ORAL_TABLET | Freq: Every day | ORAL | 0 refills | Status: AC

## 2024-05-22 MED ORDER — CEPHALEXIN 500 MG PO CAPS: 500.0000 mg | ORAL_CAPSULE | Freq: Four times a day (QID) | ORAL | 0 refills | Status: AC

## 2024-05-22 NOTE — ED Provider Notes (Signed)
 " [facility]    CSN: [id number] Arrival date & time: 05/22/24  1806      History   Chief Complaint Chief Complaint  Patient presents with   Breast Problem    HPI Elizabeth Schneider is a 59 y.o. female.   States has had boil/cysts to breast in the past.  States has a routine she follows if it has ruptured or remains just a raised area. Noticed area under right breast a few weeks ago. States noticed drainage 2 nights ago and her usual intervention does not appear to be working. States increase in pain, drainage, and redness.      Past Medical History:  Diagnosis Date   Anxiety    Chronic back pain    mid and lower (05/27/2015)   Chronic bronchitis (HCC)    COVID 08/2018   Depression    Fibroid    Fibromyalgia    GERD (gastroesophageal reflux disease)    Heart murmur    clicking murmur   Hypertension    Lupus    Obesity    Type II diabetes mellitus (HCC)     Patient Active Problem List   Diagnosis Date Noted   2019 novel coronavirus disease (COVID-19) 09/09/2018   COVID-19 virus detected 09/09/2018   Pain in the chest    SOB (shortness of breath)    Type 2 diabetes mellitus with obesity 05/27/2015   Fatigue 05/27/2015   Generalized weakness 05/27/2015   Chest pain 05/26/2015   Syncope 05/26/2015    Past Surgical History:  Procedure Laterality Date   APPENDECTOMY  ~ 1988   CESAREAN SECTION  1985; 1987; 1991   ENDOMETRIAL BIOPSY  2016   LAPAROSCOPIC CHOLECYSTECTOMY  2000   TUBAL LIGATION  1991    OB History     Gravida  3   Para  3   Term      Preterm      AB      Living  3      SAB      IAB      Ectopic      Multiple      Live Births               Home Medications    Prior to Admission medications  Medication Sig Start Date End Date Taking? Authorizing Provider  cephALEXin  (KEFLEX ) 500 MG capsule Take 1 capsule (500 mg total) by mouth 4 (four) times daily. 05/22/24  Yes Luiscarlos Kaczmarczyk A, FNP  omeprazole (PRILOSEC)  40 MG capsule Take 40 mg by mouth daily.   Yes [provider]  traZODone  (DESYREL ) 100 MG tablet Take 100 mg by mouth at bedtime. 05/07/24  Yes [provider]  acyclovir (ZOVIRAX) 400 MG tablet Take 400 mg by mouth 2 (two) times daily as needed (for 5 days).  03/27/15   [provider]  atorvastatin (LIPITOR) 40 MG tablet Take by mouth.    [provider]  buPROPion  (WELLBUTRIN  XL) 150 MG 24 hr tablet Take 450 mg by mouth at bedtime.    [provider]  CINNAMON PO Take 1 capsule by mouth at bedtime.    [provider]  clobetasol  ointment (TEMOVATE ) 0.05 % Apply 1 application topically 2 (two) times daily. Place on the affected area twice a day for 2 weeks at a time for a flare.  Then reduce usage to twice a week at bedtime. 02/20/21   Amundson C Silva, Brook E, MD  Estradiol  10 MCG TABS vaginal tablet Insert 1 tablet vaginally night at bedtime for 2 weeks, then insert 1 tablet vaginally at bedtime twice a week. 11/24/20   Cathlyn JAYSON Nikki Bobie FORBES, MD  lisinopril  (ZESTRIL ) 40 MG tablet Take 40 mg by mouth daily. Take half 10/15/20   [provider]  Lysine  1000 MG TABS Take 1,000 mg by mouth daily.    [provider]  Melatonin 5 MG TABS Take 5 mg by mouth at bedtime.     [provider]  meloxicam (MOBIC) 15 MG tablet Take 1 tablet by mouth daily. 10/15/20   [provider]  NONFORMULARY OR COMPOUNDED ITEM Vaginal boric acid suppository.  Place 600 mg suppository in vagina at bedtime nightly for 2 weeks.  BORIC ACID IS FATAL IF TAKEN ORALLY. 11/21/20   Amundson C Silva, Brook E, MD  nystatin -triamcinolone  (MYCOLOG II) cream Apply 1 application topically 2 (two) times daily. Apply to affected area BID for up to 7 days. 10/29/20   Amundson C Silva, Brook E, MD  omega-3 acid ethyl esters (LOVAZA) 1 g capsule Take 1 g by mouth at bedtime.    [provider]  omeprazole (PRILOSEC) 40 MG capsule Take 40 mg by  mouth at bedtime.  06/30/13   [provider]  TRULICITY 1.5 MG/0.5ML SOPN Inject into the skin. 10/15/20   [provider]  vitamin C  (VITAMIN C ) 500 MG tablet Take 1 tablet (500 mg total) by mouth daily. 09/12/18   Ghimire, Donalda HERO, MD  zinc  sulfate 220 (50 Zn) MG capsule Take 1 capsule (220 mg total) by mouth daily. 09/12/18   Ghimire, Donalda HERO, MD    Family History Family History  Problem Relation Age of Onset   Hypertension Father    Heart disease Father    Cancer Sister     Social History Social History[1]   Allergies   Hydrocodone, Other, Pravastatin, and Sulfa antibiotics   Review of Systems Review of Systems   Physical Exam Triage Vital Signs ED Triage Vitals  Encounter Vitals Group     BP 05/22/24 1819 127/87     Girls Systolic BP Percentile --      Girls Diastolic BP Percentile --      Boys Systolic BP Percentile --      Boys Diastolic BP Percentile --      Pulse Rate 05/22/24 1819 95     Resp 05/22/24 1819 20     Temp 05/22/24 1819 98.7 F (37.1 C)     Temp Source 05/22/24 1819 Oral     SpO2 05/22/24 1819 96 %     Weight --      Height --      Head Circumference --      Peak Flow --      Pain Score 05/22/24 1821 7     Pain Loc --      Pain Education --      Exclude from Growth Chart --    No data found.  Updated Vital Signs BP 127/87 (BP Location: Right Arm)   Pulse 95   Temp 98.7 F (37.1 C) (Oral)   Resp 20   LMP 05/10/2017   SpO2 96%   Visual Acuity Right Eye Distance:   Left Eye Distance:   Bilateral Distance:    Right Eye Near:   Left Eye Near:    Bilateral Near:     Physical Exam   UC Treatments / Results  Labs (all  labs ordered are listed, but only abnormal results are displayed) Labs Reviewed - No data to display  EKG   Radiology No results found.  Procedures Procedures (including critical care time)  Medications Ordered in UC Medications - No data to display  Initial Impression / Assessment  and Plan / UC Course  I have reviewed the triage vital signs and the nursing notes.  Pertinent labs & imaging results that were available during my care of the patient were reviewed by me and considered in my medical decision making (see chart for details).     *** Final Clinical Impressions(s) / UC Diagnoses   Final diagnoses:  Breast abscess     Discharge Instructions      Treating for infection with Keflex .  Continue warm compresses and keep area dressed for drainage.  Follow-up as needed for any continued issues with your primary care   ED Prescriptions     Medication Sig Dispense Auth. Provider   cephALEXin  (KEFLEX ) 500 MG capsule Take 1 capsule (500 mg total) by mouth 4 (four) times daily. 28 capsule Adah Corning A, FNP      PDMP not reviewed this encounter.    [1]  Social History Tobacco Use   Smoking status: Never   Smokeless tobacco: Never  Substance Use Topics   Alcohol use: Not Currently   Drug use: No   "

## 2024-05-22 NOTE — ED Triage Notes (Signed)
 States has had boil/cysts to breast in the past.  States has a routine she follows if it has ruptured or remains just a raised area. Noticed area under right breast a few weeks ago. States noticed drainage 2 nights ago and her usual intervention does not appear to be working. States increase in pain, drainage, and redness. Has 5 different autoimmune disorders.

## 2024-05-22 NOTE — Discharge Instructions (Signed)
 Treating for infection with Keflex .  Continue warm compresses and keep area dressed for drainage.  Follow-up as needed for any continued issues with your primary care

## 2024-05-23 ENCOUNTER — Encounter
